# Patient Record
Sex: Male | Born: 1935 | Race: White | Hispanic: No | Marital: Married | Smoking: Never smoker
Health system: Southern US, Community
[De-identification: ages and names within clinical notes are randomized; demographics above are authoritative.]

## PROBLEM LIST (undated history)

## (undated) DIAGNOSIS — D649 Anemia, unspecified: Secondary | ICD-10-CM

## (undated) DIAGNOSIS — K219 Gastro-esophageal reflux disease without esophagitis: Secondary | ICD-10-CM

## (undated) DIAGNOSIS — E119 Type 2 diabetes mellitus without complications: Secondary | ICD-10-CM

## (undated) DIAGNOSIS — I2510 Atherosclerotic heart disease of native coronary artery without angina pectoris: Secondary | ICD-10-CM

## (undated) DIAGNOSIS — I10 Essential (primary) hypertension: Secondary | ICD-10-CM

## (undated) DIAGNOSIS — F0390 Unspecified dementia without behavioral disturbance: Secondary | ICD-10-CM

## (undated) DIAGNOSIS — N189 Chronic kidney disease, unspecified: Secondary | ICD-10-CM

## (undated) DIAGNOSIS — J189 Pneumonia, unspecified organism: Secondary | ICD-10-CM

## (undated) DIAGNOSIS — R4182 Altered mental status, unspecified: Secondary | ICD-10-CM

## (undated) HISTORY — PX: CORONARY ARTERY BYPASS GRAFT: SHX141

## (undated) HISTORY — PX: JOINT REPLACEMENT: SHX530

## (undated) HISTORY — PX: REPLACEMENT TOTAL KNEE BILATERAL: SUR1225

---

## 2000-04-08 ENCOUNTER — Encounter: Admission: RE | Admit: 2000-04-08 | Discharge: 2000-04-08 | Payer: Self-pay | Admitting: *Deleted

## 2000-04-08 ENCOUNTER — Encounter: Payer: Self-pay | Admitting: *Deleted

## 2000-07-23 ENCOUNTER — Ambulatory Visit (HOSPITAL_COMMUNITY): Admission: RE | Admit: 2000-07-23 | Discharge: 2000-07-23 | Payer: Self-pay | Admitting: Gastroenterology

## 2000-07-23 ENCOUNTER — Encounter (INDEPENDENT_AMBULATORY_CARE_PROVIDER_SITE_OTHER): Payer: Self-pay | Admitting: Specialist

## 2000-08-17 ENCOUNTER — Emergency Department (HOSPITAL_COMMUNITY): Admission: EM | Admit: 2000-08-17 | Discharge: 2000-08-17 | Payer: Self-pay | Admitting: Emergency Medicine

## 2003-06-19 ENCOUNTER — Emergency Department (HOSPITAL_COMMUNITY): Admission: EM | Admit: 2003-06-19 | Discharge: 2003-06-19 | Payer: Self-pay | Admitting: Emergency Medicine

## 2003-06-19 ENCOUNTER — Encounter: Payer: Self-pay | Admitting: Emergency Medicine

## 2003-07-14 ENCOUNTER — Encounter: Payer: Self-pay | Admitting: Internal Medicine

## 2003-07-14 ENCOUNTER — Encounter: Admission: RE | Admit: 2003-07-14 | Discharge: 2003-07-14 | Payer: Self-pay | Admitting: Internal Medicine

## 2003-11-02 ENCOUNTER — Ambulatory Visit (HOSPITAL_COMMUNITY): Admission: RE | Admit: 2003-11-02 | Discharge: 2003-11-02 | Payer: Self-pay | Admitting: Sports Medicine

## 2003-12-17 ENCOUNTER — Encounter: Admission: RE | Admit: 2003-12-17 | Discharge: 2003-12-17 | Payer: Self-pay | Admitting: Sports Medicine

## 2004-06-19 ENCOUNTER — Encounter: Admission: RE | Admit: 2004-06-19 | Discharge: 2004-06-19 | Payer: Self-pay | Admitting: Sports Medicine

## 2004-09-14 ENCOUNTER — Ambulatory Visit (HOSPITAL_COMMUNITY): Admission: RE | Admit: 2004-09-14 | Discharge: 2004-09-14 | Payer: Self-pay | Admitting: Gastroenterology

## 2004-09-14 ENCOUNTER — Encounter (INDEPENDENT_AMBULATORY_CARE_PROVIDER_SITE_OTHER): Payer: Self-pay | Admitting: *Deleted

## 2005-05-23 ENCOUNTER — Emergency Department (HOSPITAL_COMMUNITY): Admission: EM | Admit: 2005-05-23 | Discharge: 2005-05-23 | Payer: Self-pay | Admitting: Emergency Medicine

## 2005-05-30 ENCOUNTER — Encounter: Admission: RE | Admit: 2005-05-30 | Discharge: 2005-05-30 | Payer: Self-pay | Admitting: Internal Medicine

## 2005-08-30 ENCOUNTER — Observation Stay (HOSPITAL_COMMUNITY): Admission: EM | Admit: 2005-08-30 | Discharge: 2005-09-01 | Payer: Self-pay | Admitting: *Deleted

## 2005-08-31 ENCOUNTER — Encounter (INDEPENDENT_AMBULATORY_CARE_PROVIDER_SITE_OTHER): Payer: Self-pay | Admitting: *Deleted

## 2006-03-06 ENCOUNTER — Ambulatory Visit: Payer: Self-pay | Admitting: Critical Care Medicine

## 2006-03-06 ENCOUNTER — Encounter: Admission: RE | Admit: 2006-03-06 | Discharge: 2006-03-06 | Payer: Self-pay | Admitting: Internal Medicine

## 2006-03-06 ENCOUNTER — Inpatient Hospital Stay (HOSPITAL_COMMUNITY): Admission: EM | Admit: 2006-03-06 | Discharge: 2006-04-11 | Payer: Self-pay | Admitting: *Deleted

## 2006-03-11 ENCOUNTER — Ambulatory Visit: Payer: Self-pay | Admitting: Internal Medicine

## 2006-04-24 ENCOUNTER — Observation Stay (HOSPITAL_COMMUNITY): Admission: EM | Admit: 2006-04-24 | Discharge: 2006-04-25 | Payer: Self-pay | Admitting: Emergency Medicine

## 2006-05-24 ENCOUNTER — Ambulatory Visit (HOSPITAL_COMMUNITY): Admission: RE | Admit: 2006-05-24 | Discharge: 2006-05-24 | Payer: Self-pay | Admitting: Internal Medicine

## 2006-06-12 ENCOUNTER — Emergency Department (HOSPITAL_COMMUNITY): Admission: EM | Admit: 2006-06-12 | Discharge: 2006-06-12 | Payer: Self-pay | Admitting: Emergency Medicine

## 2006-08-10 ENCOUNTER — Encounter: Admission: RE | Admit: 2006-08-10 | Discharge: 2006-08-10 | Payer: Self-pay | Admitting: Internal Medicine

## 2007-05-11 IMAGING — CT CT ABDOMEN W/ CM
1 of 3 series · 13 of 32 positions shown, 19 images · IV contrast (READICA/WATER & [ID] OMNI 300)
Comparison: none

CLINICAL DATA: Weight loss.
 CHEST CT WITH CONTRAST:
TECHNIQUE: Multidetector CT imaging of the chest was performed following the standard protocol during bolus administration of intravenous contrast.
 Contrast:  100 cc of Omnipaque 300.
 On lung window images, no lung nodule is seen.  No effusion is noted.  Prior changes of median sternotomy for CABG are noted.  On mediastinal window images, there is no evidence of mediastinal or hilar adenopathy.  The pulmonary arteries and thoracic aorta appear grossly normal.  The thoracic aorta enhances normally with no evidence of aneurysm.  No bony abnormality is seen.

[Series 2: chest/abd/pelvis · axial · 0.84mm/px · z∈[-372,+12]mm · 13 of 91 slices shown, 19 images]
[im 7/91  soft-tissue]
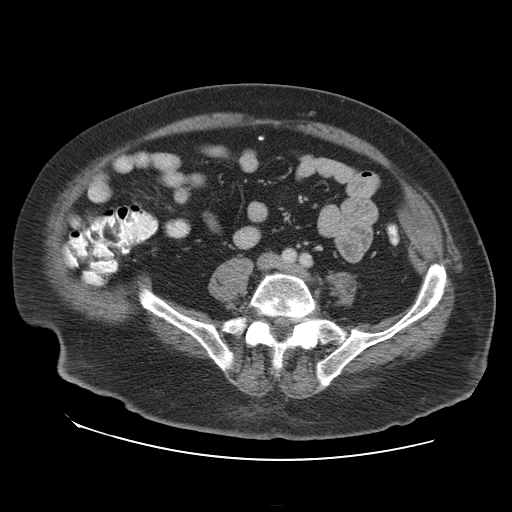
[im 7/91  bone]
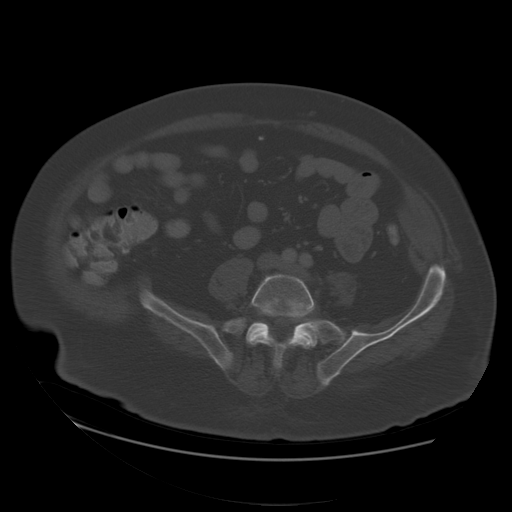
[im 13/91  soft-tissue]
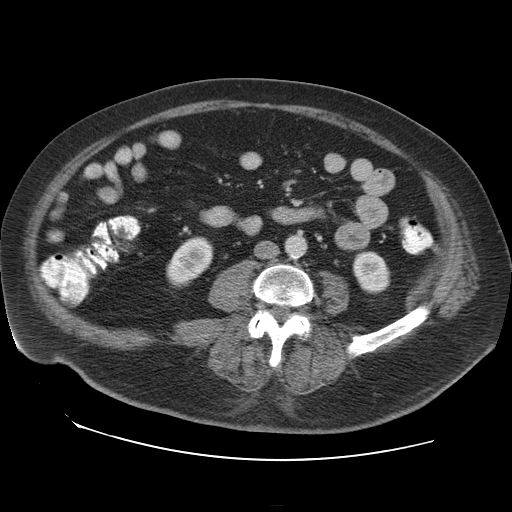
[im 19/91  soft-tissue]
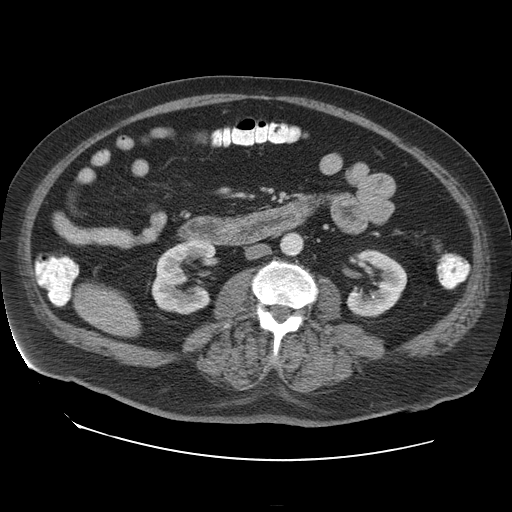
[im 25/91  soft-tissue]
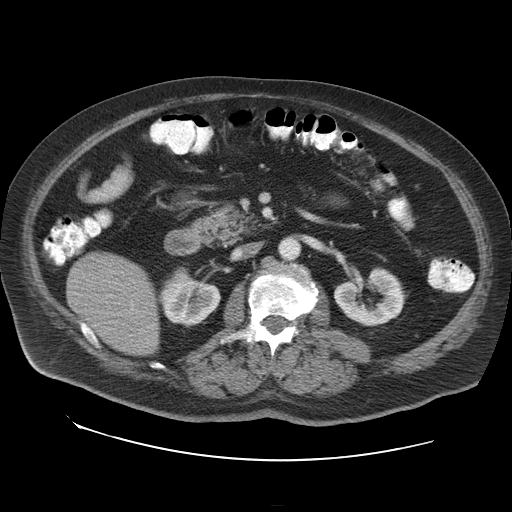
[im 31/91  soft-tissue]
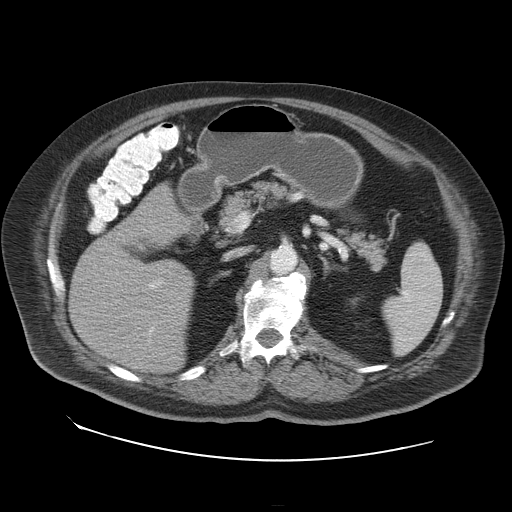
[im 37/91  soft-tissue]
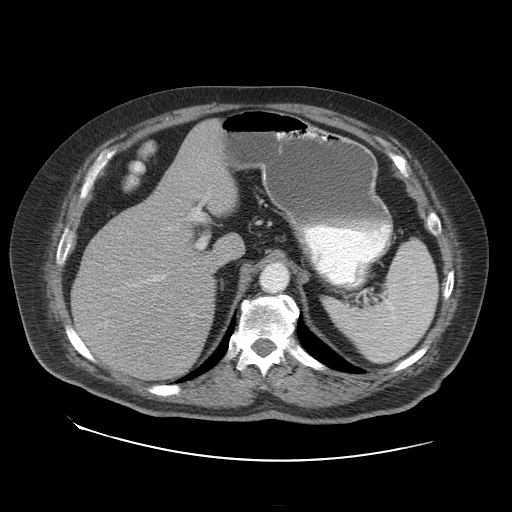
[im 49/91  soft-tissue]
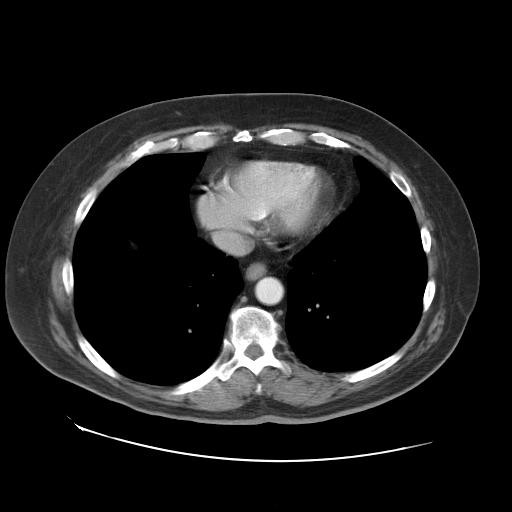
[im 55/91  soft-tissue]
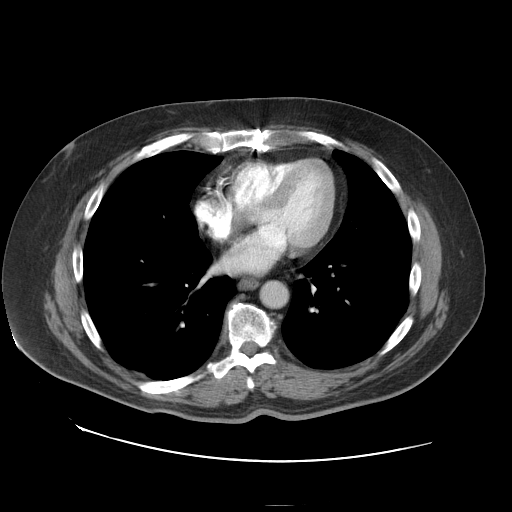
[im 61/91  soft-tissue]
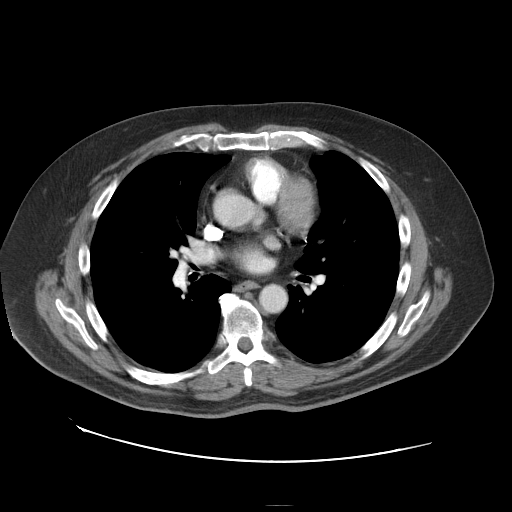
[im 61/91  bone]
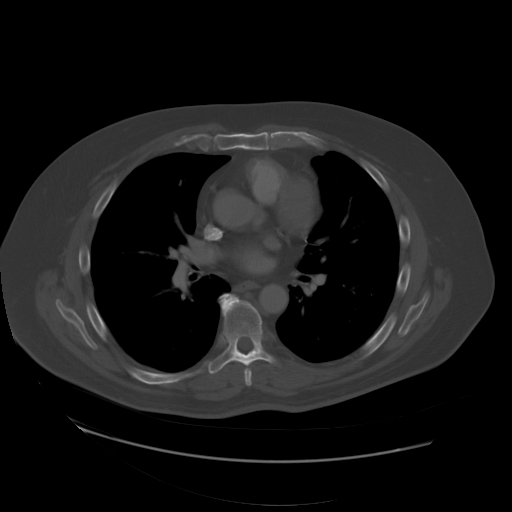
[im 67/91  soft-tissue]
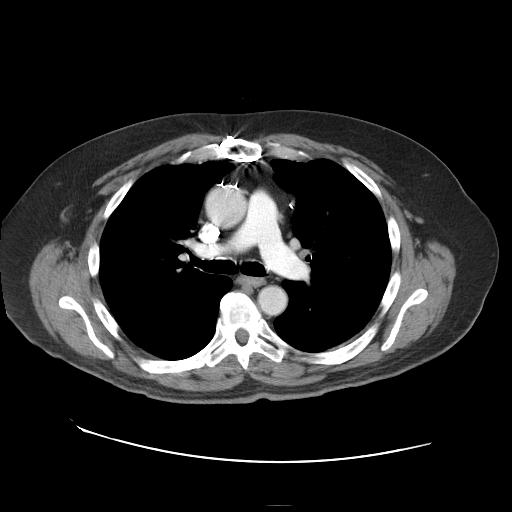
[im 67/91  lung]
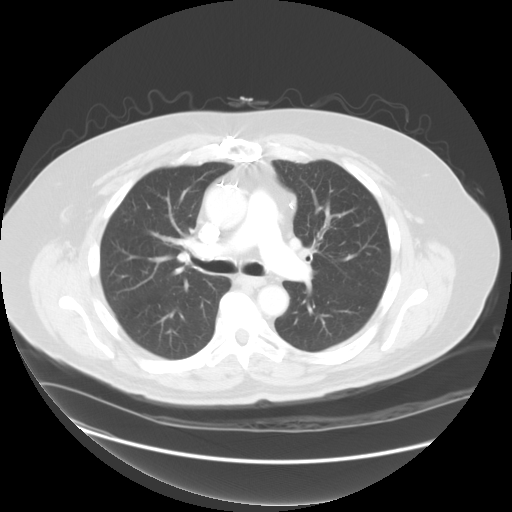
[im 73/91  soft-tissue]
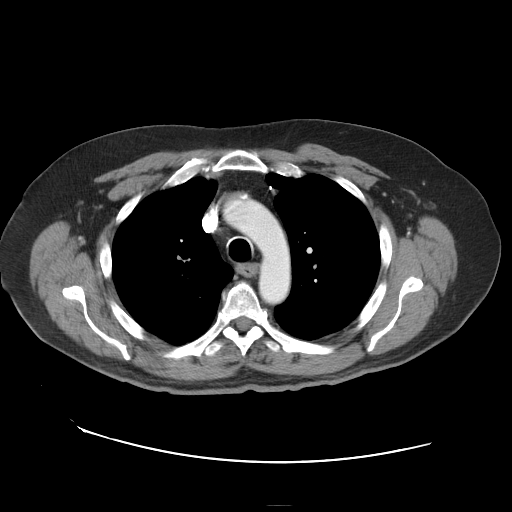
[im 73/91  lung]
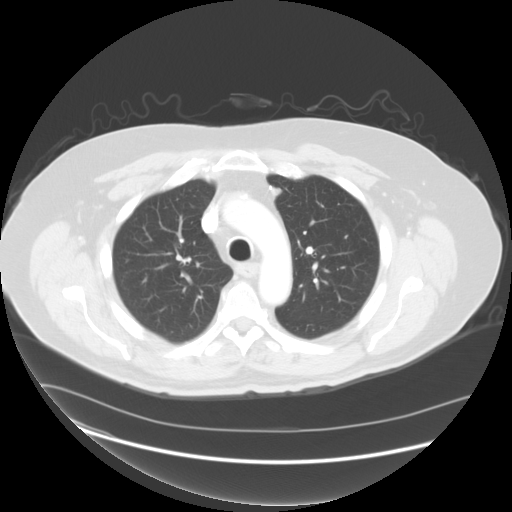
[im 79/91  soft-tissue]
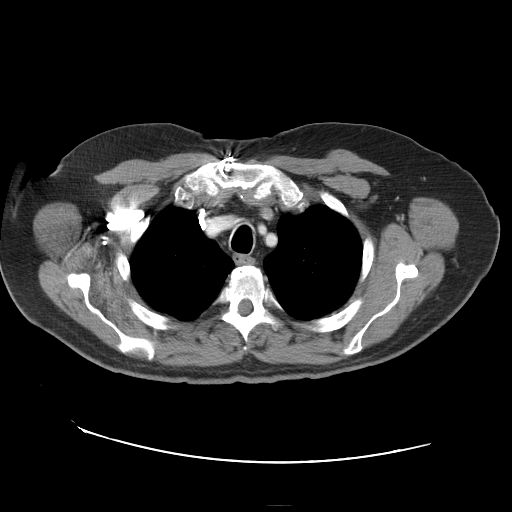
[im 79/91  lung]
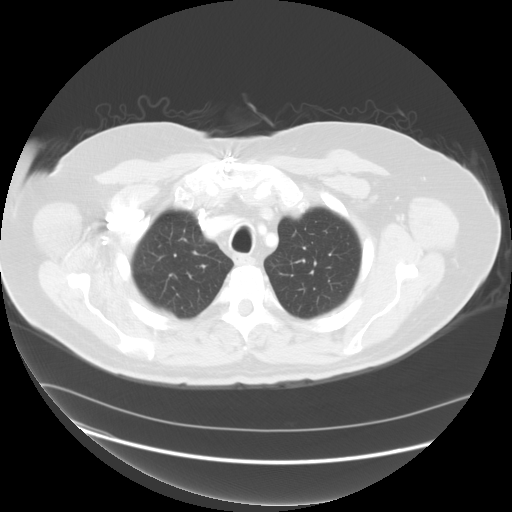
[im 85/91  soft-tissue]
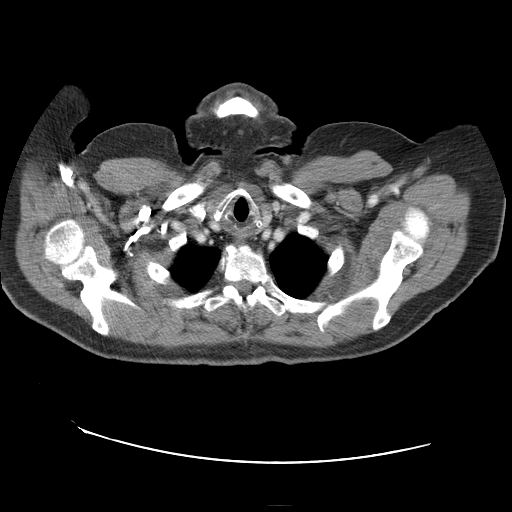
[im 85/91  lung]
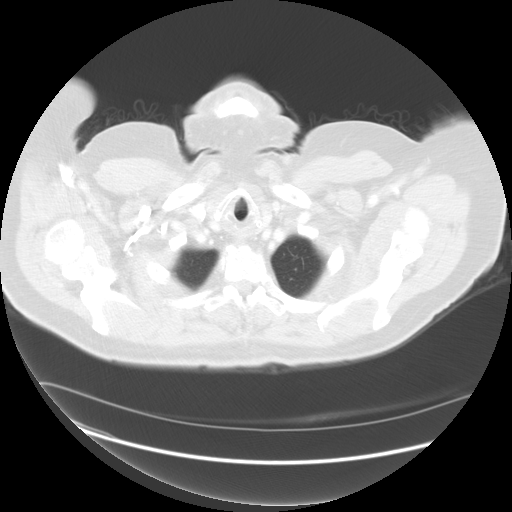

[13 of 32 positions shown; findings below may reference images not displayed]

IMPRESSION: Negative CT of the chest.  Prior CABG.
 ABDOMEN CHEST WITH CONTRAST:
 Scans were continued through the abdomen after oral and IV contrast were given.  The liver enhances normally with no focal abnormality and no ductal dilatation is seen. Gallbladder is contracted, and no calcified gallstones are seen.   The pancreas is normal in size with normal peripancreatic fat planes.  The adrenal glands and spleen appear normal.  Kidneys enhance normally and, on delayed images, pelvocaliceal systems appear normal.  The abdominal aorta is normal in caliber.  There are degenerative changes in the lower lumbar spine.
IMPRESSION: 1.  Negative CT of the abdomen for mass or adenopathy.
 2.  Degenerative changes noted in the lower lumbar spine.

## 2007-07-31 ENCOUNTER — Encounter: Admission: RE | Admit: 2007-07-31 | Discharge: 2007-07-31 | Payer: Self-pay | Admitting: Internal Medicine

## 2009-04-06 ENCOUNTER — Encounter: Admission: RE | Admit: 2009-04-06 | Discharge: 2009-04-06 | Payer: Self-pay | Admitting: Internal Medicine

## 2009-12-16 ENCOUNTER — Emergency Department (HOSPITAL_COMMUNITY): Admission: EM | Admit: 2009-12-16 | Discharge: 2009-12-16 | Payer: Self-pay | Admitting: Emergency Medicine

## 2010-11-19 ENCOUNTER — Encounter (HOSPITAL_BASED_OUTPATIENT_CLINIC_OR_DEPARTMENT_OTHER): Payer: Self-pay | Admitting: Internal Medicine

## 2010-11-30 ENCOUNTER — Other Ambulatory Visit: Payer: Self-pay | Admitting: Dermatology

## 2011-01-17 LAB — DIFFERENTIAL
Basophils Relative: 0 % (ref 0–1)
Eosinophils Absolute: 0 10*3/uL (ref 0.0–0.7)
Lymphocytes Relative: 4 % — ABNORMAL LOW (ref 12–46)
Monocytes Relative: 4 % (ref 3–12)

## 2011-01-17 LAB — POCT I-STAT, CHEM 8
Chloride: 107 mEq/L (ref 96–112)
Creatinine, Ser: 1.5 mg/dL (ref 0.4–1.5)
Glucose, Bld: 177 mg/dL — ABNORMAL HIGH (ref 70–99)
HCT: 42 % (ref 39.0–52.0)
Hemoglobin: 14.3 g/dL (ref 13.0–17.0)
Sodium: 139 mEq/L (ref 135–145)

## 2011-01-17 LAB — CBC
Hemoglobin: 14.6 g/dL (ref 13.0–17.0)
RBC: 4.42 MIL/uL (ref 4.22–5.81)
WBC: 6.6 10*3/uL (ref 4.0–10.5)

## 2011-01-29 ENCOUNTER — Other Ambulatory Visit: Payer: Self-pay | Admitting: Internal Medicine

## 2011-01-29 DIAGNOSIS — R52 Pain, unspecified: Secondary | ICD-10-CM

## 2011-01-29 DIAGNOSIS — R609 Edema, unspecified: Secondary | ICD-10-CM

## 2011-01-30 ENCOUNTER — Ambulatory Visit
Admission: RE | Admit: 2011-01-30 | Discharge: 2011-01-30 | Disposition: A | Discharge status: Home / Self Care | Payer: Medicare Other | Source: Ambulatory Visit | Attending: Internal Medicine | Admitting: Internal Medicine

## 2011-01-30 DIAGNOSIS — R609 Edema, unspecified: Secondary | ICD-10-CM

## 2011-01-30 DIAGNOSIS — R52 Pain, unspecified: Secondary | ICD-10-CM

## 2011-03-16 NOTE — H&P (Signed)
NAME:  Charles Guzman, Charles Guzman NO.:  [id number]   MEDICAL RECORD NO.:  [id number]          PATIENT TYPE:  INP   LOCATION:  0102                         FACILITY:  [facility]   PHYSICIAN:  Hillery Aldo, M.D.   DATE OF BIRTH:  August 08, 1929   DATE OF ADMISSION:  04/24/2006  DATE OF DISCHARGE:                                HISTORY & PHYSICAL   PRIMARY CARE PHYSICIAN:  Dr. Karilyn Cota.   CHIEF COMPLAINT:  None.  The patient was sent here for an elevated INR.   HISTORY OF PRESENT ILLNESS:  The patient is a 77 year old male with a recent  prolonged hospitalization secondary to ventilatory-dependent respiratory  failure, bilateral pneumonia, ARDS, and MRSA, as well as soon as Pseudomonas  pneumonia.  He was hospitalized from Mar 06, 2006 through April 11, 2006.  The  patient is on chronic Coumadin therapy due to pulmonary embolism diagnosed  in 1994.  He was sent to the emergency department by his skilled nursing  facility after routine lab work showed that his PT was greater than 90 and  his INR greater than 11.9.  The patient denies any bleeding gums, blood in  the urine, blood in the stool, or any other signs of bleeding.  He is  admitted for reversal of his Coumadin.   PAST MEDICAL HISTORY:  1.  Recent prolonged hospitalization secondary to ventilator-dependent      respiratory failure secondary to MRSA and pseudomonal pneumonia along      with adult respiratory distress syndrome.  2.  Bacteremia/sepsis secondary to MRSA.  3.  Pulmonary embolism in 1994, on chronic Coumadin therapy.  4.  History of metabolic encephalopathy.  5.  Coronary artery disease, status post coronary artery bypass graft in      1995.  6.  Hypertension.  7.  Dysphasia.  8.  Diarrhea, chronic.  9.  Acute-on-chronic kidney disease.  10. Type 2 diabetes mellitus.  11. Anemia of chronic disease and iron deficiency, status post 2 units of      packed red blood cells during his previous hospitalization.  12.  Stage I to II decubitus ulcer of the sacrum.  13. Deconditioning.  14. Status post bilateral total knee replacements.  15. Hyperlipidemia.  16. Chronic lower extremity edema, greater on the left than the right with a      history of thrombophlebitis.  17. Internal and external hemorrhoids on colonoscopy in the past.  18. Remote history of tobacco abuse.  19. Obesity.   FAMILY HISTORY:  Noncontributory due to advanced age.   SOCIAL HISTORY:  The patient is a resident of [facility].  He is married.  He has a remote history of tobacco use, no alcohol or drug use.   ALLERGIES:  No known drug allergies.   CURRENT MEDICATIONS:  1.  Ferrous sulfate 325 mg daily.  2.  Lipitor 20 mg nightly.  3.  Questran 2 g daily.  4.  Pepto-Bismol on tablet b.i.d. p.r.n.  5.  Coumadin 4 mg daily.  6.  Metoprolol 50 mg b.i.d.  7.  Multivitamin with iron daily.  8.  Pepcid 20 mg b.i.d.  9.  Flora Q one capsule daily.  10. Ensure pudding t.i.d.  11. Sliding-scale insulin q.a.c. and h.s.  12. Diflucan 100 mg daily.   REVIEW OF SYSTEMS:  As per HPI.  The patient has some chronic diarrhea  otherwise.  No chest pain, shortness of breath, cough, or other symptoms.   PHYSICAL EXAM:  VITAL SIGNS:  Temperature 98.3, pulse 78, respirations 18,  blood pressure 109/56, O2 saturation 99% on room air.  GENERAL:  Elderly male in no acute distress.  HEENT:  Normocephalic, atraumatic.  PERRL.  EOMI.  Oropharynx is clear.  NECK:  Supple, no thyromegaly, no lymphadenopathy, no jugular venous  distention.  CHEST:  Lungs clear to auscultation bilaterally with good air movement.  HEART:  Regular rate, rhythm.  No murmurs, rubs, or gallops.  ABDOMEN:  Soft, nontender, non-distended with normoactive bowel sounds.  RECTAL:  Deferred, given increased INR.  EXTREMITIES:  The patient does have asymmetric edema with the left lower  extremity much greater than the right.  SKIN:  The patient is pale.  No  rashes.  He does  have a stage I to II decubitus ulcer with non-blanching  redness about the sacrum and some areas of skin with a denuded appearance.  NEUROLOGIC:  The patient is sleeping, but awakens easily.  He is oriented to  self and place.  Moves all extremities x4 with equal strength.  Nonfocal.   DATA REVIEW:  PT is 86.8, INR 11.4.  Hemoglobin is 11.6, hematocrit 34.0.   ASSESSMENT AND PLAN:  Supratherapeutic INR:  We will admit the patient for  observation and reversal of his Coumadin.  Would give him 2 units of fresh  frozen plasma now and 2 mg of vitamin K orally.  We will reinitiate Coumadin  treatment per Pharmacy, once his INR is approaching the therapeutic level.   Would continue his routine medicines for his chronic medical problems.      Hillery Aldo, M.D.  Electronically Signed     CR/MEDQ  D:  04/24/2006  T:  04/24/2006  Job:  [id number]   cc:   Wilson Singer, M.D.  Fax: [phone]

## 2011-03-16 NOTE — Op Note (Signed)
NAME:  Charles Guzman, Charles Guzman NO.:  [id number]   MEDICAL RECORD NO.:  [id number]          PATIENT TYPE:  AMB   LOCATION:  ENDO                         FACILITY:  [facility]   PHYSICIAN:  Petra Kuba, M.D.    DATE OF BIRTH:  26-Mar-1929   DATE OF PROCEDURE:  09/14/2004  DATE OF DISCHARGE:                                 OPERATIVE REPORT   PROCEDURE:  Colonoscopy with biopsy.   INDICATIONS:  Patient with a history of colon polyps, due for colonic  screening.  Also some lower abdominal pain with alternating diarrhea and  constipation.   Consent was signed after risks, benefits, methods, and options thoroughly  discussed in the office on multiple occasions.   MEDICINES USED:  Demerol 70, Versed 7.   PROCEDURE:  Rectal inspection is pertinent for external hemorrhoids, small.  Digital exam was negative.  The video pediatric adjustable colonoscope was  inserted and with abdominal pressure, easily able to be advanced around the  colon to the cecum.  On insertion, no obvious abnormalities were seen.  The  cecum was identified by the appendiceal orifice and the ileocecal valve.  The prep was adequate.  There was some stool that required washing and  suctioning, but on slow withdrawal through the colon, three tiny  questionable polyps were seen, two in the transverse, one in the proximal  sigmoid.  All were cold biopsied x1-2.  The transverse was put in one  container, and the sigmoid was put in a second container.  No other  abnormalities were seen as we slowly withdrew back to the rectum.  Anorectal  pull-through and retroflexion confirmed some small hemorrhoids.  The scope  was straightened and readvanced shortways up the left side of the colon.  Air and water were suctioned.  The scope removed.  The patient tolerated the  procedure well.  There was no obvious immediate complication.   ENDOSCOPIC DIAGNOSES:  1.  Internal/external hemorrhoids.  2.  Three tiny questionable  polyps, one in the sigmoid and two in the      transverse, all cold-biopsied.  3.  Otherwise within normal limits to the cecum.   PLAN:  Await pathology but barring a significant surprise, consider  rechecking in five years if doing well medically, otherwise will restart his  Coumadin as usual dose.  Happy to see back p.r.n.  If pain continues,  probably  will need a one-time CT scan, either by me or Dr. Karilyn Cota.  He might  consider a fiber supplement adding to his regimen, to see if that would help  granulate his bowels.  Possibly a good cleaning will help.  I will be happy  to see back sooner p.r.n.      MEM/MEDQ  D:  09/14/2004  T:  09/14/2004  Job:  [id number]   cc:   Wilson Singer, M.D.  [address]  [place]  [place] [address]  Fax: [phone]

## 2011-03-16 NOTE — Discharge Summary (Signed)
NAME:  Charles Guzman, Charles Guzman.:  [id number]   MEDICAL RECORD NO.:  [id number]           PATIENT TYPE:   LOCATION:                               FACILITY:  [facility]   PHYSICIAN:  Michaelyn Barter, M.D. DATE OF BIRTH:  30-May-1929   DATE OF ADMISSION:  03/06/2006  DATE OF DISCHARGE:  04/11/2006                               DISCHARGE SUMMARY   This is a final interim discharge summary that will follow the discharge  summary that was dictated by Dr. Elliot Cousin on April 05, 2006.  For  events that occurred between Mar 06, 2006 up until the April 05, 2006,  please refer to the discharge summary that was dictated by Dr. Elliot Cousin.  This discharge summary will only outline those events that  occurred during the time frame of April 08, 2006 up until April 11, 2006.    1. Metabolic encephalopathy.  This appeared to be in the state of      resolution.  2. MRSA/Pseudomonas pneumonia.  Likewise, this also appeared to be      resolved.  The patient remained without any new complaints      regarding this.  He did not demonstrate any overt signs of      respiratory distress during the last few days of his      hospitalization.  3. Pulmonary embolism.  The patient did not have any complaints of      chest pain or shortness of breath.  He remained on his      anticoagulation during the last few days of hospitalization.  4. Diarrhea is resolved during the last few days of his      hospitalization.  5. Anemia.  The patient's hemoglobin was noted to have declined to 8.9      by April 09, 2006.  As a result, he received at least 1 unit of      packed RBCs.  His hemoglobin remained stable throughout the      remaining portion of his hospitalization.  6. Diabetes mellitus.  The patient's sugars remained slightly elevated      during the course of his hospitalization.  It was determined that      this could further be followed by his primary care physician once      he is discharged from  the hospital.  7. Chronic kidney disease.  The patient's BUN and creatinine remained      relatively stable, although slightly above normal in the latter      portion of his hospitalization.  8. History of hypertension.  The patient's blood pressure remained      stable during the last portion of his hospitalization.   The decision was made to discharge the patient from the hospital.  On  the date of discharge, the patient had no new complaints.   VITAL SIGNS:  Temperature was 97.3, heart rate 107, respirations 18,  blood pressure 124/62.  He was sating 95% on room air.   His white blood cell count was 12.6, hemoglobin 11, hematocrit 32,  platelets 155.  PT 24.7, INR 2.2.  Sodium 136, potassium 4, chloride  109, CO2 19, BUN 14, creatinine 1.6, glucose 124.  INR 2.2.  Calcium  8.3.   The decision was made to discharge the patient to [facility]  [place], subacute nursing facility.      Michaelyn Barter, M.D.  Electronically Signed     OR/MEDQ  D:  12/05/2006  T:  12/05/2006  Job:  [id number]

## 2011-03-16 NOTE — Procedures (Signed)
[facility]  Patient:    Charles Guzman, Charles Guzman                   MRN: [id number] Proc. Date: 07/23/00 Adm. Date:  [id number] Attending:  Nelda Marseille CC:         Georg Ruddle. Viviann Spare, M.D.   Procedure Report  PROCEDURE:  Colonoscopy with polypectomy.  INDICATIONS:  Guaiac positivity in a patient due for colonic screening.  INFORMED CONSENT:  Consent was signed after risk, benefits, methods and options thoroughly discussed in the office.  MEDICINES USED:  Demerol 50 mg, Versed 5 mg.  DESCRIPTION OF PROCEDURE:  Rectal inspection is pertinent for his previous hemorrhoidal or fissure surgery.  Digital examination by Dr. Riki Altes, the internal medicine resident working with [place] this month was negative. Dr. Gwendolyn Fill inserted the video colonoscope under my guidance and advanced easily to about 90 cm.  At that point the scope began to loop and I took over the controls and was easily able to advance to the cecum.  On insertion, three left-sided polyps were seen.  To advance to the cecum required rolling him on his back and some abdominal pressure.  The cecum was identified by the appendicial orifice and the ileocecal valve.  In fact the scope was inserted a short ways in the terminal ileum which was normal.  Further documentation was obtained.  The scope was slowly withdrawn.  Prep was adequate.  There was some liquid stool that required washing and suctioning.  On slow withdrawal through the colon, the cecum, ascending and transverse were normal.  The scope was withdrawn around the left side of the colon.  The polyps seen on insertion were brought into view.  They were about the mid sigmoid tube distal descending.  All three were snared.  Electrocautery applied.  The two smaller ones were able to be suctioned through the scope and collected in the trap. The middle one was removed, suctioned onto the head of the scope and the scope was removed and the  polyp recovered.  The scope was reinserted to the polypectomy sites.  All had nice white coagulants without any bleeding.  The scope was further withdrawn back in the rectum.  A 2 mm polyp was seen, snared, electrocautery applied and the polyp removed.  The scope was retroflexed revealing some small internal hemorrhoids.  The scope was straightened and readvanced a short ways up the left side of the colon.  Air was suctioned.  The scope removed.  The patient tolerated the procedure well. There was no obvious intermediate complication.  ENDOSCOPIC DIAGNOSIS: 1. Internal and external hemorrhoids. 2. Tiny rectal polyp, status post snare. 3. Three small to medium sigmoid distal descending polyps, all snared. 4. Otherwise within normal limits to the terminal ileum.  PLAN:  Restart Coumadin in three days.  Use 2.5 a day for a week and then regular dose is 5 on Monday, Wednesday, Friday, alternating with 2.5 on the other days.  GI followup p.r.n. or in six weeks to recheck guaiac symptoms to make sure a one time upper GI small-bowel follow-through is not needed and probably recheck colon screening if doing well in three years pending pathology. DD:  07/23/00 TD:  07/24/00 Job: [id number]

## 2011-03-20 NOTE — Discharge Summary (Signed)
NAME:  Charles Guzman, Charles Guzman NO.:  [id number]   MEDICAL RECORD NO.:  [id number]          PATIENT TYPE:  INP   LOCATION:  1422                         FACILITY:  [facility]   PHYSICIAN:  Isidor Holts, M.D.  DATE OF BIRTH:  Jan 28, 1929   DATE OF ADMISSION:  04/24/2006  DATE OF DISCHARGE:  04/25/2006                                 DISCHARGE SUMMARY   PMD:  Dr. Karilyn Cota   DISCHARGE DIAGNOSES:  1.  Coagulopathy, secondary to over anticoagulation with Coumadin.  2.  History of previous pulmonary embolism, on chronic anticoagulation.  3.  History of coronary artery disease, status post coronary artery bypass      graft 1995.  4.  Hypertension.  5.  Diabetes mellitus.  6.  Anemia of chronic disease.  7.  Dyslipidemia.  8.  History of chronic diarrhea.  9.  Dysphagia.   DISCHARGE MEDICATIONS:  1.  Ferrous sulfate 325 mg p.o. daily.  2.  Lipitor 20 mg p.o. q.h.s.  3.  Questran 2 g p.o. daily.  4.  Pepto-Bismol one tablet p.o. b.i.d. p.r.n.  5.  Metoprolol 50 mg p.o. b.i.d.  6.  Multivitamin with iron one pill p.o. daily.  7.  Pepcid 20 mg p.o. b.i.d.  8.  Fluoro-Q one capsule p.o. daily.  9.  Ensure pudding one p.o. t.i.d.  10. Sliding scale insulin q.a.c. and h.s.  11. Coumadin 3 mg p.o. daily, otherwise as per INR.  12. Note, Diflucan has been discontinued.   PROCEDURES:  None.   CONSULTATIONS:  None.   ADMISSION HISTORY:  As in H&P note of April 24, 2006 dictated by Dr.  Hillery Aldo.  However, in brief, this is a 77 year old male, with  multiple medical problems, including coronary artery disease, prior  pulmonary embolism on chronic anti-Coumadin therapy, hypertension,  dysphasia, chronic diarrhea, chronic renal insufficiency, type 2 diabetes  mellitus, anemia of chronic disease, dyslipidemia.  Resident of skilled  nursing facility.  Status post hospitalization from Mar 06, 2006 to April 11, 2006.  He was referred to the emergency department on April 24, 2006  for an  incidental finding of INR greater than 11.9 unassociated with bruising or  overt bleeding.  He was admitted for reversal of anticoagulation.   CLINICAL COURSE:  #1 - OVER ANTICOAGULATION:  This is secondary to Coumadin  treatment.  Patient had until recently been on Diflucan and it is felt that  this may be the culprit secondary to drug-drug interaction.  His INR at the  time of presentation was 11.9, however, there were no overt signs of  bleeding or bruising and his hemoglobin was reasonable at 11.6 with a  hematocrit of 34.  Patient was managed with intravenous infusion of 2 units  of FFP as well as intravenous vitamin K with complete reversal of  anticoagulation.  As a matter of fact, INR as of April 25, 2006 was 1.5.  Patient has been recommenced on Coumadin therapy, at a lower dose of 3mg   daily initially, and it is recommended that his INR be checked on 04/27/2006,  and further adjustments be made, if indicated.  Background clinical problems remained otherwise stable, and there were no  other issues, during patient`s hospitalization.   DISPOSITION:  Patient was discharged in satisfactory condition on 04/25/06.   DIET:  Healthy Heart/Carbohydrate-modified.   ACTIVITY:  As tolerated, otherwise, per PT/OT.   PAIN MANAGEMENT:  N/A   FOLLOW-UP:  Patient is to follow up routinely with primary MD, per prior  scheduled appointment.  We expect PMD to monitor INR at appropriate intervals, and adjust Coumadin  dosage as indicated.      Isidor Holts, M.D.  Electronically Signed     CO/MEDQ  D:  04/25/2006  T:  04/25/2006  Job:  [id number]

## 2011-03-20 NOTE — H&P (Signed)
NAME:  Charles Guzman, Charles Guzman NO.:  [id number]   MEDICAL RECORD NO.:  [id number]          PATIENT TYPE:  EMS   LOCATION:  MAJO                         FACILITY:  [facility]   PHYSICIAN:  Lonia Blood, M.D.DATE OF BIRTH:  1929/10/08   DATE OF ADMISSION:  03/06/2006  DATE OF DISCHARGE:                                HISTORY & PHYSICAL   PRIMARY CARE PHYSICIAN:  Dr. Karilyn Cota   CARDIOVASCULAR PHYSICIAN:  Dr. Swaziland   CHIEF COMPLAINT:  Shortness of breath and chest pain.   HISTORY OF PRESENT ILLNESS:  Mr. Charles Guzman is a very pleasant 76-year-  old gentleman with an extensive medical history as detailed below.  Approximately 4 days ago he began to experience the acute onset of a left-  sided lower chest discomfort.  He describes this as a tearing and a sharp  sensation.  It is worse if he takes a deep cough.  Approximately 24 hours  later he began to develop significant cough productive of very thick  yellowish and greenish sputum.  He thinks he may have had fever but has not  measured his temperature at home.  He has felt very weak.  His p.o. intake  has been limited.  He has continued to take his medications.  The primary  issue has been the pain, as far as he is concerned.  He presented to his  primary care physician and was set up for an outpatient CT scan.  The CT  scan was accomplished at [facility] today with evidence of a dense left lower lobe  and possible lingular infiltrate versus neoplasm.  He was advised to report  to [facility] emergency room because of significant respiratory distress.   At the time of my evaluation the patient is resting in a hospital bed at  [facility].  He states that he continues to have pleuritic-type pain in his  left lateral ribcage but he denies that he is short of breath.  Nonetheless,  he is breathing at approximately 40 times per minute at present and taking  very shallow breaths.  He says that deep breaths do cause some  exacerbation  of his pain.  He denies nausea or vomiting.  He denies anginal-type  substernal pressure.  He denies change in bowel or bladder habits.   REVIEW OF SYSTEMS:  Comprehensive review of systems is unremarkable with the  exception of elements noted in history of present illness above.   PAST MEDICAL HISTORY:  1.  Coronary artery disease status post coronary artery bypass graft in      1995.  2.  Diabetes mellitus type 2.  3.  History of pulmonary embolus in 1994 with the patient being on Coumadin      since that time.  4.  Chronic renal insufficiency with baseline creatinine 1.2 in November      2006.  5.  Status post bilateral total knee replacements.  6.  Hyperlipidemia.  7.  Remote history of tobacco abuse.  8.  Chronic lower extremity edema with the left being worse than the right,      thought to be  secondary to thrombophlebitis.  9.  Internal and external hemorrhoids on colonoscopy in the past.   OUTPATIENT MEDICATIONS:  The patient is not clear on the medications that he  takes.  Old records indicate use of Coumadin, Glucophage, Altace, Lipitor,  and TriCor.   ALLERGIES:  No known drug allergies.   FAMILY HISTORY:  Noncontributory secondary to advanced age.   SOCIAL HISTORY:  The patient does not drink.  He currently lives with his  wife in [place].  There is no one else in the home with them.   DATA REVIEW:  White count is markedly elevated at 16,000.  Hemoglobin is low  at 11.8 with an MCV of 91.  Platelet count is normal.  Sodium is low at 129.  Potassium, chloride, and bicarb are all normal.  BUN is elevated at 24,  creatinine is elevated at 2.0, serum glucose is elevated at 305, calcium is  normal.  LFTs are all normal.  Albumin is 3.9.  Creatinine was 1.30 August 2005.  INR is elevated at 1.7.  Urinalysis reveals 250 glucose, small  hemoglobin, 15 of ketones, 30 of protein, but is otherwise unremarkable. D-  dimer is elevated at 0.69.  Myoglobin is  elevated at greater than 500.  CK-  MB and troponin I are negative.  CT scan of the chest reveals a left lower  lobe and lingular consolidation suspicious for infiltrate versus neoplasm  but no evidence of pulmonary embolus.   PHYSICAL EXAMINATION:  VITAL SIGNS:  Temperature 100.4, blood pressure  144/82, heart rate 116 to 127, respiratory rate 25, O2 saturation is 94% on  room air recorded, but presently 92% on 4 L per minute nasal cannula.  GENERAL:  Well-developed, well-nourished male who is in obvious respiratory  distress, was taking short breaths and breathing at 40 times per minute.  HEENT:  Normocephalic, atraumatic.  Pupils are equal and round but minimally  reactive/sluggish.  OC/OP is remarkable for dry mucous membranes.  NECK:  No appreciable JVD, lymphadenopathy, or thyromegaly.  LUNGS:  The patient has severe coarse bibasilar crackles with no expiratory  wheeze at the present time.  CARDIOVASCULAR:  Tachycardic at approximately 116 beats per minute to 127  beats per minute without appreciable gallop or rub.  ABDOMEN:  Overweight, soft, bowel sounds hypoactive but present.  No mass,  no ascites, nondistended.  EXTREMITIES:  Show 1+ right lower extremity edema and 3+ left lower  extremity edema without erythema or discharge.  NEUROLOGIC:  Alert and oriented x4.  Cranial nerves II-XII intact  bilaterally, 5/5 strength bilateral upper and lower extremities, intact  sensation to touch throughout.   IMPRESSION AND PLAN:  1.  Severe left lower lobe community-acquired pneumonia.  Because of the      severity of the patient's infiltrate there is even concern that there      could be an underlying mass.  We will treat the patient empirically for      his community-acquired pneumonia with Rocephin and azithromycin.  We      will need to reevaluate the patient's parenchyma with a CT scan after      the patient stabilizes from a clinical standpoint. 2.  Acute respiratory distress.   The patient is tachypneic at the present      time.  He is taking very short respirations (is splinting) because of      his chest pain.  We will use a modest amount of narcotic to attempt to  relieve his pain and we will encourage him to take slow deep breaths.      ABG has not been obtained and therefore I have ordered this for      evaluation of the patient's pCO2 and pO2.  I will place the patient in      stepdown as I fear that there is a high likelihood that the patient will      require intubation should his pattern of breathing not improve      significantly.  We will administer nebulizers in attempt to improve his      breathing.  3.  Dehydration.  The patient is clinically significantly dehydrated.  This      is likely secondary to fever with decreased p.o. intake.  We will      hydrate the patient judiciously using isotonic saline fluid.  We will      follow his BUN and creatinine ratio and physical exam.  4.  Hyponatremia.  This is thought to be secondary to hypovolemia.  We will      hydrate the patient with isotonic IV fluid and we will follow up labs in      the morning.  5.  Normocytic anemia.  The patient has a normocytic anemia.  The extent of      previous workup is not clear.  At this time I will avoid further workup      until the patient is more stable.  Review of office records may in fact      reveal that the patient has had this evaluated in the past.  I am aware      that he has had a colonoscopy within the last 5 years.  We will follow      his hemoglobin trend for now.  6.  History of pulmonary embolism.  Full anticoagulation will be maintained      using Lovenox.  7.  Diabetes mellitus.  We will hold all p.o. medications.  The patient will      be limited to a clear liquid diet as I fear that he may require      intubation later this evening.  Sliding scale insulin will be      administered as appropriate.      Lonia Blood, M.D.   Electronically Signed     JTM/MEDQ  D:  03/06/2006  T:  03/06/2006  Job:  [id number]   cc:   Wilson Singer, M.D.  Fax: [phone]

## 2011-03-20 NOTE — Discharge Summary (Signed)
NAME:  Charles Guzman, Charles Guzman NO.:  [id number]   MEDICAL RECORD NO.:  [id number]          PATIENT TYPE:  OBV   LOCATION:  2039                         FACILITY:  [facility]   PHYSICIAN:  Lonia Blood, M.D.      DATE OF BIRTH:  [birth year]   DATE OF ADMISSION:  08/30/2005  DATE OF DISCHARGE:  09/01/2005                                 DISCHARGE SUMMARY   PRIMARY CARE PHYSICIAN:  Dr. Wilson Singer.   CARDIOLOGIST:  Dr. Peter Swaziland.   DISCHARGE DIAGNOSES:  1.  Altered mental status change.  2.  Presumed syncopal episode.  3.  Hypoglycemia.  4.  Coronary artery disease, stable.  5.  History of diabetes, type 2.  6.  History of PE, on Coumadin therapy.  7.  Chronic kidney disease.  8.  Chronic left lower extremity edema bilaterally.   DISCHARGE MEDICATIONS:  1.  Metformin 1000 mg p.o. b.i.d.  2.  Glyburide reduced 5 mg b.i.d.  3.  Altace 5 mg b.i.d.  4.  Lipitor 20 mg daily.  5.  Fenofibrate 130 mg daily.   DISPOSITION:  The patient is being discharged home in good health. No  recurrence of altered mental status change in the hospital. The patient also  has glyburide dose change to a total of 5 mg bid from 10 mg b.i.d. He will  follow up with Dr. Karilyn Cota  who will adjust his medications as an  outpatient.   PROCEDURES PERFORMED:  1.  Head CT without contrast performed on August 30, 2005, shows no acute      intracranial pathology.  2.  2-D echo performed on August 31, 2005, showed normal EF of 55% to 65%.      Mildly increased aortic valve thickness. Otherwise, no left ventricular      wall motion abnormalities. Carotid Dopplers also performed on August 31, 2005, was normal with no evidence of ICA stenosis. Lower extremity      Doppler performed on August 31, 2005, showed no DVT, SVT, or Baker's      cyst.   CONSULTATIONS:  None.   BRIEF HISTORY:  Please refer to dictated history and physical by Dr. Lonia Blood, which showed, however, that this is a  77 year old man with history of  coronary artery disease and diabetes. He was brought to the ED by his  daughter secondary to passing out.  The patient did not remember what  happened for about four hours prior to coming in. The patient called his  daughter. He was lying on the floor at that time and needed her to get up.  He has had episodes of hypoglycemia frequently. Three days prior to coming  in EMS had to come out to his house to give him IV glucose.  In the ED he  was found to be stable. His vital signs were also stable. Sodium is 130,  potassium 4.6, chloride 106. His glucose was 128 on arrival. The patient was  admitted mainly for workup, possible syncope, but also hypoglycemia with  history of hypoglycemic episodes.   HOSPITAL COURSE:  Problem 1. Possible syncopal episode. Workup performed  included ruling out MI. Check a 2-D echo. The patient had a head CT  performed which was negative. The patient did not have any recurrent  symptoms in the hospital. He was mainly placed for observation. The only  thing observed was his sugars were dropping below 70s after admission. This  indicated that the patient was most likely having hypoglycemic episodes. He  is already on glyburide taking 20 mg a day in addition to metformin. The  patient admitted to not taking food as he used to since his wife went to a  nursing home from stroke and this may explain why, as an incident of taking  more glyburide but less food. He is currently stable. He will be discharged  after adjustment of his of sulfonylurea dosing.   Problem 2. Hypoglycemic episode. As indicated above, this was presumed to be  the cause of the patient's problems. He was initially placed on 50 mL of D-5  water in the hospital and his sugars were in the range of 117, 102, 101, and  132.  The patient resumed eating adequately the following morning and the D-  5 water was discontinued and his CBGs were checked.  All this time he was   off the sulfonylureas and currently he has no single episode of hypoglycemia  in the hospital. With this in mind, we made the following changes.  His  glyburide has been changed to 5 mg b.i.d. He has been asked to check his  blood sugars regularly at home and follow up with Dr. Karilyn Cota who will make  further adjustments.   Problem 3.  Coronary artery disease. This was stable in the hospital.   Problem 4. Diabetes. The patient's hemoglobin A1C was found to be 6.9  indicating that he is getting relatively okay control at home.   Problem 5. Chronic kidney disease. His BUN and creatinine were stable this  admission.   Problem 6. Chronic left lower extremity edema. Due to lower extremity edema  the patient had lower extremity Dopplers performed to rule out DVT as the  possible cause of this prolonged swelling. However no evidence of DVT from  the lower extremity Dopplers. He is on Coumadin for PE as indicated. His INR  is 2.0 and is therapeutic.      Lonia Blood, M.D.  Electronically Signed     LG/MEDQ  D:  09/01/2005  T:  09/01/2005  Job:  [id number]

## 2011-03-20 NOTE — Discharge Summary (Signed)
NAME:  GYAN, CAMBRE NO.:  [id number]   MEDICAL RECORD NO.:  [id number]          PATIENT TYPE:  INP   LOCATION:  5710                         FACILITY:  [facility]   PHYSICIAN:  Elliot Cousin, M.D.    DATE OF BIRTH:  Oct 03, 1929   DATE OF ADMISSION:  03/06/2006  DATE OF DISCHARGE:                                 DISCHARGE SUMMARY   DISCHARGE DIAGNOSES:  1.  Ventilator dependent respiratory failure secondary to methicillin-      resistant Staphylococcus aureus and pseudomonal pneumonia and adult      respiratory disease syndrome.  2.  Bacteremia/sepsis secondary to methicillin-resistant Staphylococcus      aureus.  3.  Persistent right upper lobe and left lower lobe air space disease      (REPEAT CT SCAN OF THE CHEST NEEDED IN THREE MONTHS).  4.  Pulmonary embolism diagnosed in 1994, continues to be maintained on      Coumadin therapy.  5.  Metabolic encephalopathy.  6.  Coronary artery disease status post coronary artery bypass graft in      1995.  7.  Hypertension.  8.  Dysphagia, now resolving.  9.  Diarrhea.  10. Acute on chronic kidney disease.  11. Type 2 diabetes mellitus.  12. Anemia, status post 2 units of packed red blood cells during the      hospitalization.  13. Decubitus ulcer of the sacrum.  14. Deconditioning.   SECONDARY DISCHARGE DIAGNOSES:  1.  Status post bilateral total knee replacements.  2.  Hyperlipidemia.  3.  Chronic lower extremity edema greater on the left lower extremity than      the right with a history of thrombophlebitis.  4.  Internal and external hemorrhoids on colonoscopy in the past.  5.  Remote history of tobacco abuse.  6.  Obesity.   CURRENT MEDICATIONS:  1.  Ferrous sulfate 325 mg daily.  2.  Lipitor 20 mg q.h.s.  3.  Questran 2 g daily. (this dose may be adjusted over the next few days).  4.  Pepto-Bismol 1 tablet once or twice daily p.r.n. for diarrhea.  5.  Coumadin, dose to be determined.  6.  Metoprolol 50  mg b.i.d. (hold for systolic blood pressure less than 100      and heart rate less than 60).  7.  Multivitamin with iron once daily.  8.  Pepcid 20 mg b.i.d.  9.  Flora-Q 1 capsule daily.  10. Ensure pudding t.i.d.  11. Sliding scale insulin regimen q.a.m. and q.h.s.   HISTORY OF PRESENT ILLNESS:  The patient is a 77 year old man with a past  medical history significant for coronary artery disease, type 2 diabetes  mellitus, and pulmonary embolism, who presented to the emergency department  on Mar 06, 2006 with a chief complaint of shortness of breath and chest pain.  The patient was seen by his primary care physician and was subsequently  evaluated with an outpatient CT scan of the chest. The CT scan of the chest  revealed a dense left lower lobe and possible lingular infiltrate versus  neoplasm. The patient was advised to  report to the [facility] emergency room  because of significant respiratory distress and for further evaluation of  the CT findings.   For additional details please see the dictated history and physical.   HOSPITAL COURSE:  #1.  VENTILATOR DEPENDENT RESPIRATORY FAILURE/BILATERAL  PNEUMONIA/ARDS/SPUTUM CULTURE POSITIVE FOR MRSA AND BRONCHIAL LAVAGE  SPECIMEN POSITIVE FOR PSEUDOMONAS. The patient was started on empiric  antibiotic therapy with Rocephin and azithromycin. Oxygen therapy was  administered to keep his oxygen saturations greater than 92%. Albuterol and  Atrovent nebulizers were given every 4 hours. At the time of hospital  admission, the patient was in mild to moderate respiratory distress.  Approximately 24 hours after he was hospitalized, the patient's respiratory  status worsened and he was transferred to ICU. Subsequently, he required  mechanical intubation and ventilation. The [facility] group was  consulted. They eventually assumed the primary care of the patient  thereafter. After the patient was placed on the ventilator, he developed   persistent fevers, and worsening bilateral infiltrates on the followup chest  x-rays. A CT scan of the chest on Mar 14, 2006 revealed bilateral  infiltrates, the largest of which was consolidation of almost the entire  right upper lobe. The CT scan of the chest also revealed right lower lobe  and left lower lobe nodular infiltrates and a moderate sized bilateral  pleural effusion. Blood cultures and sputum cultures were ordered. The blood  cultures became positive approximately 24 hours later for Gram positive  cocci which were eventually identified as MRSA. The sputum also grew out  MRSA. As a consequence of these findings, Rocephin and azithromycin were  discontinued and the patient was started on antibiotic therapy with  Vancomycin and Zosyn. Over the course of the first two weeks of  hospitalization, the patient did not appear to be improving clinically. He  developed an ARDS picture. Xigris was started by the intensivist. Infectious  disease physician, Dr. Orvan Falconer, was consulted and recommended adding  rifampin. Subsequently, a bronchoscopy was performed. The bronchial lavage  specimen eventually grew out pseudomonas. The patient was therefore started  on Primaxin and gentamycin while the Zosyn was discontinued. After  sensitivities were resulted, the gentamycin was discontinued and the patient  was started on Fortaz.   Over the course of the hospitalization, the patient became less febrile and  eventually afebrile. The bilateral infiltrates began to clear some. The  patient was eventually extubated from the ventilator. Per the  recommendations of Dr. Orvan Falconer, the patient was maintained on Vancomycin  therapy for 28 days. The course of Vancomycin was completed during this  hospitalization. The patient was maintained on Fortaz for the recommended  number of days. A repeat chest x-ray on April 02, 2006 revealed persistent right upper lobe and left lower lobe air space disease. During  the initial  evaluation of the patient, there was some concern regarding whether or not  there was an underlying pulmonary neoplasm. Given the patient's recovery  from the acute pneumonia/ARDS, a repeat CT scan of the chest for followup  evaluation is favored and warranted. If the patient continues to demonstrate  radiographic findings of persistent bilateral air space disease, he may need  a diagnostic bronchoscopy or another method of diagnoses. Currently he is  oxygenating within normal limits. He is in no acute respiratory distress.   #2.  HISTORY OF PULMONARY EMBOLISM.  The patient was diagnosed with a  pulmonary embolism in 1994. However, he had been maintained on Coumadin  treatment.  The Coumadin was discontinued at the time that the patient was  ventilated. However, it has since been restarted. He is currently on full  dose Lovenox until his Coumadin becomes therapeutic at 2-3. The pharmacy  staff is currently adjusting the doses. His INR will need to be monitored  very closely following hospital discharge.   #3.  CORONARY ARTERY DISEASE WITH A HISTORY OF CABG. Over the past several  days, the patient has been stable with regards to his cardiac status. He has  no complaints of chest pain. He is currently being maintained on metoprolol  50 mg b.i.d. Lipitor will be restarted for his history of hyperlipidemia.  Will hold off on restarting aspirin given that he is chronically  anticoagulated with Coumadin.   #4.  METABOLIC ENCEPHALOPATHY. The patient developed moderate confusion and  delirium during the majority of the hospital course. This was felt to be  secondary to toxic metabolic encephalopathy. Earlier on in the hospital  course, a CT scan of the head was ordered and was negative. Several days  ago, a followup evaluation of delirium was undertaken. The results of the  lab data are as follows, ammonia level 22, well within normal limits,  vitamin B12 821, within normal limits,  RBC folate 1395 well within normal  limits, RPR nonreactive, and the repeat CT scan of the head negative for any  acute intracranial findings. Over the past 2-3 days, the patient is  beginning to become more alert and oriented. His speech is much clearer now.  He follows directions well. He still is a little confused with regards to  time and year; however, he is now oriented to himself and to place.  Neurologically his cranial nerves are intact. Hopefully as time goes on, the  encephalopathy will completely resolve.   #5.  DYSPHAGIA. The patient was started on tube feedings via a panda tube  during his stay in the ICU. Following extubation, he failed the swallowing  study. Another followup swallowing evaluation occurred several days ago and  the patient actually passed. The speech therapist recommended a dysphagia II  (chopped) diet with thin liquids. Full supervision with his meals have been ordered. Following hospital discharge, the patient will need to be assisted  with all of his meals and fully supervised. His diet may be upgraded once  his dentures are more secured. The family will be asked to bring in another  set of dentures and/or the appropriate adhesives to secure his dentures more  firmly.   #6.  HYPERTENSION. The patient's blood pressures have been well-controlled  on metoprolol 50 mg b.i.d.   #7.  TYPE 2 DIABETES MELLITUS. The patient's CBGs/glycemia had been  uncontrolled up until recently. Once the tube feedings were discontinued,  his capillary blood sugars have been ranging from 88 to 150. He had been  treated with a combination of Lantus and a sliding scale insulin regimen.  Both have now been discontinued. However as the patient's intake improves, a  sliding scale insulin regimen with NovoLog at least twice a day should be  restarted.   #8.  ACUTE ON CHRONIC KIDNEY DISEASE. The patient developed acute renal  failure during his stay in the ICU. Following  extubation, his renal function  has progressively improved. As of today, his BUN is 26 and his creatinine is  1.5. Per the history in the past, his baseline creatinine is 1.2.   #9.  ANEMIA. The patient's hemoglobin fell to a nadir of 7.5 during the  hospital course. He was transfused 2 units of packed red blood cells. His  hemoglobin has remained stable over the past several days. It is now 9.5.  The patient has been started on a multivitamin with iron once daily as well  as ferrous sulfate 325 mg daily.   #10.  PERSISTENT DIARRHEA. The patient has experienced multiple loose and  runny diarrheal stools. While he was in the ICU, a rectal tube was placed.  He was also started on treatment with Questran twice daily. Multiple stool  specimens have been sent and they have all been negative for C Dif. The  stool culture is currently pending. There was no evidence of fecal WBCs. In  addition to the Questran, Pepto-Bismol was started several days ago.  Hopefully the addition of Pepto-Bismol will help to solidify his stools. The  patient still has the rectal tube inserted however the tube itself actually  may be contributing to the problem. A trial of discontinuing the rectal tube  will be started today. Further monitoring and adjustments in the  antidiarrheal medications will be made accordingly.   #11.  STAGE 1 TO STAGE 2 SACRAL DECUBITUS ULCER. The patient is being  treated with frequent turns and barrier creams and ointments. A wound care  facilitator will be consulted for further evaluation and management.   #12.  DECONDITIONING. The patient is very deconditioned. He will need to be  placed in a skilled nursing facility for ongoing physical and occupational  therapy. He is now approaching medical stability and hopefully next week, he  will be medically ready for hospital discharge. Acquisition of a skilled  nursing facility bed is pending.  DISPOSITION:  The patient is currently stable  and improved. As mentioned  above, he will need to be discharged to a skilled nursing facility for  ongoing therapy; if the patient and his family agree.  Following hospital  discharge, he will need to be assisted with all of his meals. He is  currently on a dysphagia II, thin liquid diet. Wound care will need to be  continued to avoid further breakdown of his decubitus ulcer. His INR/PT will  need to be monitored frequently. The adjustments in the doses of Coumadin  will be deferred to the patient's physician at the skilled nursing facility.  The patient may or may not need to be discharged on Lovenox. Currently his  INR is subtherapeutic.   This is an interim summary. An addendum will be dictated accordingly.      Elliot Cousin, M.D.  Electronically Signed     DF/MEDQ  D:  04/05/2006  T:  04/05/2006  Job:  [id number]   cc:   Wilson Singer, M.D.  Fax: [phone]   Peter M. [place], M.D.  Fax: [phone]

## 2011-03-20 NOTE — H&P (Signed)
NAME:  Charles Guzman, Charles Guzman NO.:  [id number]   MEDICAL RECORD NO.:  [id number]          PATIENT TYPE:  OBV   LOCATION:  2039                         FACILITY:  [facility]   PHYSICIAN:  Lonia Blood, M.D.       DATE OF BIRTH:  25-Nov-1928   DATE OF ADMISSION:  08/30/2005  DATE OF DISCHARGE:                                HISTORY & PHYSICAL   PRIMARY CARE PHYSICIAN:  Dr. Karilyn Cota   PRIMARY CARDIOLOGIST:  Dr. Peter [place]   CHIEF COMPLAINT:  Altered mental status.   HISTORY OF PRESENT ILLNESS:  Charles Guzman is a 77 year old man with history  of coronary artery disease and diabetes who was brought to the emergency  room by his daughter.  This morning Charles Guzman woke up in his usual state  of health but he then had an episode of passing out and he does not remember  what happened for about four hours this morning.  He called his daughter and  he was lying on the floor and he needed help to get up.  Apparently three  days prior to admission Charles Guzman had an episode of hypoglycemia and he  was seen by the EMS services and was given intravenous glucose to  recuperate.  Patient's medications have not been changed recently and he  also denies any chest pain or dyspnea.  No nausea.  No vomiting.  Not  feeling sick.   PAST MEDICAL HISTORY:  1.  Pulmonary embolism in 1994.  2.  Coronary artery disease.  3.  Coronary artery bypass grafting 1995.  4.  Diabetes mellitus.  5.  Bilateral total knee replacement.  6.  Hyperlipidemia.  7.  Hypertension.   HOME MEDICATIONS:  Metformin, Glyburide, Coumadin, Altace, Lipitor, and  fenofibrate.  Doses unknown.   Patient has no known drug allergies.   SOCIAL HISTORY:  He is a former tobacco user, but quit a while ago.  Does  not drink alcohol.  Lives alone.  He is married and his wife is in a skilled  nursing facility.  He has an only daughter that is very caring and has been  trying to take care of him.   FAMILY HISTORY:   Noncontributory.   REVIEW OF SYSTEMS:  Positive for some low extremity edema that is chronic  and after the knee replacement.  Also, very poor hearing with bilateral  hearing aids and a 40 pound weight loss over the past six months.  All other  systems are negative.   PHYSICAL EXAMINATION:  VITAL SIGNS:  Temperature 98.3, blood pressure  155/85, heart rate 90, respirations 20, saturation 100% on room air.  GENERAL:  Patient appears well-developed, well-nourished, in no acute  distress sitting in bed, alert and oriented place, person, time.  Extremely  hard of hearing.  HEENT:  Head is normocephalic, atraumatic.  Eyes are pupil equal and round,  react to light and accommodation.  Extraocular movements are intact.  Sclerae anicteric.  Throat is clear.  NECK:  Supple, without JVD.  No carotid bruits.  LUNGS:  Clear to auscultation bilaterally without wheezes, rhonchi, or  crackles.  HEART:  Regular rate and rhythm without murmurs, rubs, or gallops.  ABDOMEN:  Soft, nontender, nondistended.  Bowel sounds are present.  SKIN:  Warm and dry.  There is no suspicious rashes.  EXTREMITIES:  His left lower extremity has 3+ edema that per the patient  apparently is chronic.  His knees have bilateral scars secondary to the knee  replacement surgeries.  NEUROLOGIC:  Cranial nerves II-XII are intact.  Strength 5/5 in all four  extremities.  Sensation is intact.  LYMPHATICS:  There is no palpable lymphadenopathy.   ADMISSION LABORATORIES:  Sodium is 138, potassium 4.3, chloride 106,  bicarbonate 25, BUN 16, creatinine 1.2, glucose 128.  PT 24.3, INR 2.2.  Urinalysis is within normal limits.  Head CT shows atrophy and no acute  changes.  White blood cell count of 6.5, hemoglobin 12.4, platelet count  206.   ASSESSMENT/PLAN:  1.  Probable syncope.  Will admit the patient for observation to the      hospital.  Will place him on telemetry.  Rule out myocardial infarction      by cardiac enzymes.   Will obtain a 2-D echocardiogram, check for the      ejection fraction as it is well known that low ejection fraction is      predictive of malignant ventricular arrhythmias.  Will put the patient      on aspirin while in the hospital.  2.  History of pulmonary embolism with chronic lower extremity edema.  Will      check a D-dimer and check left lower extremity venous ultrasound.  If      there is any residual clot in the left lower extremity consideration      should be given to placement of an inferior vena cava filter.  Will      continue the Coumadin full dose while the patient is in the hospital.  3.  Diabetes mellitus with history of hypoglycemia.  I will hold Metformin      and Glyburide while in the hospital.  Check hemoglobin A1c and check the      CBGs every four hours.  Plan is to discharge this patient on Metformin      alone as Glyburide is a well-known culprit to cause hypoglycemia in the      elderly.  4.  Acute renal insufficiency.  Will hold the ACE inhibitor, recheck      creatinine tomorrow morning.  5.  Gastrointestinal prophylaxis using Protonix.      Lonia Blood, M.D.  Electronically Signed     SL/MEDQ  D:  08/30/2005  T:  08/31/2005  Job:  [id number]   cc:   Wilson Singer, M.D.  Fax: [phone]

## 2011-04-06 ENCOUNTER — Other Ambulatory Visit: Payer: Self-pay | Admitting: Internal Medicine

## 2011-04-12 ENCOUNTER — Other Ambulatory Visit: Payer: Self-pay | Admitting: Internal Medicine

## 2011-04-12 ENCOUNTER — Ambulatory Visit
Admission: RE | Admit: 2011-04-12 | Discharge: 2011-04-12 | Disposition: A | Discharge status: Home / Self Care | Payer: Medicare Other | Source: Ambulatory Visit | Attending: Internal Medicine | Admitting: Internal Medicine

## 2011-05-18 ENCOUNTER — Ambulatory Visit (HOSPITAL_COMMUNITY)
Admission: RE | Admit: 2011-05-18 | Discharge: 2011-05-18 | Disposition: A | Discharge status: Home / Self Care | Payer: Medicare Other | Source: Ambulatory Visit | Attending: Gastroenterology | Admitting: Gastroenterology

## 2011-05-18 ENCOUNTER — Ambulatory Visit (HOSPITAL_COMMUNITY): Payer: Medicare Other

## 2011-05-18 ENCOUNTER — Other Ambulatory Visit: Payer: Self-pay | Admitting: Gastroenterology

## 2011-05-18 DIAGNOSIS — Z01812 Encounter for preprocedural laboratory examination: Secondary | ICD-10-CM | POA: Insufficient documentation

## 2011-05-18 DIAGNOSIS — Z86711 Personal history of pulmonary embolism: Secondary | ICD-10-CM | POA: Insufficient documentation

## 2011-05-18 DIAGNOSIS — K449 Diaphragmatic hernia without obstruction or gangrene: Secondary | ICD-10-CM | POA: Insufficient documentation

## 2011-05-18 DIAGNOSIS — K2940 Chronic atrophic gastritis without bleeding: Secondary | ICD-10-CM | POA: Insufficient documentation

## 2011-05-18 DIAGNOSIS — I10 Essential (primary) hypertension: Secondary | ICD-10-CM | POA: Insufficient documentation

## 2011-05-18 DIAGNOSIS — Z794 Long term (current) use of insulin: Secondary | ICD-10-CM | POA: Insufficient documentation

## 2011-05-18 DIAGNOSIS — R1310 Dysphagia, unspecified: Secondary | ICD-10-CM | POA: Insufficient documentation

## 2011-05-18 DIAGNOSIS — E119 Type 2 diabetes mellitus without complications: Secondary | ICD-10-CM | POA: Insufficient documentation

## 2011-05-18 DIAGNOSIS — I2510 Atherosclerotic heart disease of native coronary artery without angina pectoris: Secondary | ICD-10-CM | POA: Insufficient documentation

## 2011-05-18 DIAGNOSIS — K219 Gastro-esophageal reflux disease without esophagitis: Secondary | ICD-10-CM | POA: Insufficient documentation

## 2011-05-18 DIAGNOSIS — Z01818 Encounter for other preprocedural examination: Secondary | ICD-10-CM | POA: Insufficient documentation

## 2011-05-18 DIAGNOSIS — E7800 Pure hypercholesterolemia, unspecified: Secondary | ICD-10-CM | POA: Insufficient documentation

## 2011-05-18 DIAGNOSIS — Z7901 Long term (current) use of anticoagulants: Secondary | ICD-10-CM | POA: Insufficient documentation

## 2011-05-18 DIAGNOSIS — K5900 Constipation, unspecified: Secondary | ICD-10-CM | POA: Insufficient documentation

## 2011-05-18 LAB — GLUCOSE, CAPILLARY: Glucose-Capillary: 186 mg/dL — ABNORMAL HIGH (ref 70–99)

## 2011-06-05 NOTE — Op Note (Signed)
NAME:  Charles Guzman, JOINER.:  [id number]  MEDICAL RECORD NO.:  [id number]  LOCATION:  [facility]                         FACILITY:  [facility]  PHYSICIAN:  Petra Kuba, M.D.    DATE OF BIRTH:  January 10, 1929  DATE OF PROCEDURE:  05/18/2011 DATE OF DISCHARGE:                              OPERATIVE REPORT   PROCEDURE:  EGD with Savary dilatation and biopsy.  INDICATIONS:  The patient with upper tract symptoms, dysphagia, abnormal swallowing study.  Consent was signed after risks, benefits, methods, options thoroughly discussed with the patient and his daughter multiple times in the past.  MEDICINES USED:  Fentanyl 25 mcg, Versed 2.5 mg.  DESCRIPTION OF PROCEDURE:  The video endoscope was inserted by direct vision.  Quick look at the vocal cords were normal.  The proximal mid- esophagus was normal and distal esophagus was a widely patent fibrous ring and a tiny hiatal hernia.  Scope passed easily into the stomach. We then slowly withdrew back through the esophagus, back to the upper esophageal sphincter which confirmed above findings.  No significant abnormalities were seen.  Scope was then advanced through a normal antrum, normal pylorus, into a normal duodenal bulb around the C-loop to a normal second portion of the duodenum.  Scope was slowly withdrawn back to the bulb and a good look there ruled out ulcers in that location.  Scope was withdrawn back to the stomach and retroflexed. Cardia and fundus were normal as was the lesser curve in the angularis along the distal greater curve was a patch of moderate gastritis which was biopsied at the end of the procedure after the dilation.  Straight visualization of the stomach did not reveal any additional findings.  We went ahead and withdrew through the esophagus which confirmed above findings and then advanced to the antrum and under both endoscopic and fluoro guidance, the Savary wire was advanced and the customary J-loop of  the wire was confirmed in the antrum, both endoscopically and under fluoro.  While the scope was removed, the wire was watched under fluoro and stayed in the proper position.  Once the scope was removed, we proceeded with the Savary 15 and then 16 mm dilators and both were confirmed in the proper position in the stomach under fluoro.  There was no resistance in passing either dilator and no heme on withdrawal from each dilator.  Once the 16 was confirmed in the stomach, the wire was withdrawn back into the dilator and both were removed in tandem.  We went ahead and reinserted the endoscope.  There was very minimal trauma in the upper esophagus and very minimal at the ring without active bleeding.  We went ahead and took our biopsies of the gastritis and then slowly withdrew.  The ring was washed and watched and could not be made to bleed.  No obvious significant complication and on slow withdrawal, there was minimal trauma to the upper esophagus as mentioned above, but no active bleeding.  Scope was withdrawn.  The patient tolerated the procedure well.  There was no obvious immediate complication.  ENDOSCOPIC DIAGNOSES: 1. Tiny hiatal hernia with a widely patent fibrous ring. 2. Patch of distal greater curve gastritis,  biopsied at the end of the     procedure. 3. Otherwise, within normal limits EGD therapy, Savary dilatation to     16 mm under fluoro without heme on the dilators or resistance in     passing without any active bleeding at the end of the procedure.  PLAN:  See how this helps.  Continue Aciphex.  Followup p.r.n. or in 6 weeks.  Will touch base with him when we review the biopsies and will resume Coumadin tomorrow as long as no delayed complications.          ______________________________ Petra Kuba, M.D.     MEM/MEDQ  D:  05/18/2011  T:  05/18/2011  Job:  [id number]  Electronically Signed by Vida Rigger M.D. on 06/05/2011 04:34:29 PM

## 2011-07-11 ENCOUNTER — Other Ambulatory Visit: Payer: Self-pay | Admitting: Dermatology

## 2011-08-30 DIAGNOSIS — J189 Pneumonia, unspecified organism: Secondary | ICD-10-CM

## 2011-08-30 HISTORY — DX: Pneumonia, unspecified organism: J18.9

## 2011-09-09 ENCOUNTER — Inpatient Hospital Stay (HOSPITAL_COMMUNITY)
Admission: EM | Admit: 2011-09-09 | Discharge: 2011-09-12 | DRG: 193 | Disposition: A | Discharge status: Home Health | Payer: Medicare Other | Source: Ambulatory Visit | Attending: Internal Medicine | Admitting: Internal Medicine

## 2011-09-09 DIAGNOSIS — E860 Dehydration: Secondary | ICD-10-CM | POA: Diagnosis present

## 2011-09-09 DIAGNOSIS — R4182 Altered mental status, unspecified: Secondary | ICD-10-CM

## 2011-09-09 DIAGNOSIS — N058 Unspecified nephritic syndrome with other morphologic changes: Secondary | ICD-10-CM | POA: Diagnosis present

## 2011-09-09 DIAGNOSIS — T45515A Adverse effect of anticoagulants, initial encounter: Secondary | ICD-10-CM

## 2011-09-09 DIAGNOSIS — I129 Hypertensive chronic kidney disease with stage 1 through stage 4 chronic kidney disease, or unspecified chronic kidney disease: Secondary | ICD-10-CM | POA: Diagnosis present

## 2011-09-09 DIAGNOSIS — G92 Toxic encephalopathy: Secondary | ICD-10-CM | POA: Diagnosis present

## 2011-09-09 DIAGNOSIS — I2510 Atherosclerotic heart disease of native coronary artery without angina pectoris: Secondary | ICD-10-CM | POA: Diagnosis present

## 2011-09-09 DIAGNOSIS — N189 Chronic kidney disease, unspecified: Secondary | ICD-10-CM | POA: Diagnosis present

## 2011-09-09 DIAGNOSIS — G929 Unspecified toxic encephalopathy: Secondary | ICD-10-CM | POA: Diagnosis present

## 2011-09-09 DIAGNOSIS — N289 Disorder of kidney and ureter, unspecified: Secondary | ICD-10-CM

## 2011-09-09 DIAGNOSIS — J189 Pneumonia, unspecified organism: Principal | ICD-10-CM | POA: Diagnosis present

## 2011-09-09 DIAGNOSIS — N179 Acute kidney failure, unspecified: Secondary | ICD-10-CM | POA: Diagnosis present

## 2011-09-09 DIAGNOSIS — J159 Unspecified bacterial pneumonia: Secondary | ICD-10-CM

## 2011-09-09 DIAGNOSIS — I951 Orthostatic hypotension: Secondary | ICD-10-CM

## 2011-09-09 DIAGNOSIS — K219 Gastro-esophageal reflux disease without esophagitis: Secondary | ICD-10-CM | POA: Diagnosis present

## 2011-09-09 DIAGNOSIS — E1129 Type 2 diabetes mellitus with other diabetic kidney complication: Secondary | ICD-10-CM | POA: Diagnosis present

## 2011-09-09 DIAGNOSIS — D649 Anemia, unspecified: Secondary | ICD-10-CM | POA: Diagnosis present

## 2011-09-09 HISTORY — DX: Essential (primary) hypertension: I10

## 2011-09-09 HISTORY — DX: Chronic kidney disease, unspecified: N18.9

## 2011-09-09 HISTORY — DX: Atherosclerotic heart disease of native coronary artery without angina pectoris: I25.10

## 2011-09-09 HISTORY — DX: Gastro-esophageal reflux disease without esophagitis: K21.9

## 2011-09-09 HISTORY — DX: Anemia, unspecified: D64.9

## 2011-09-09 HISTORY — DX: Altered mental status, unspecified: R41.82

## 2011-09-09 HISTORY — DX: Pneumonia, unspecified organism: J18.9

## 2011-09-09 NOTE — ED Notes (Signed)
Family reports the patient has been getting progessively confused and they are concerned and wants him evaluated.

## 2011-09-10 ENCOUNTER — Other Ambulatory Visit: Payer: Self-pay

## 2011-09-10 ENCOUNTER — Observation Stay (HOSPITAL_COMMUNITY): Payer: Medicare Other

## 2011-09-10 ENCOUNTER — Emergency Department (HOSPITAL_COMMUNITY): Payer: Medicare Other

## 2011-09-10 ENCOUNTER — Encounter: Payer: Self-pay | Admitting: *Deleted

## 2011-09-10 DIAGNOSIS — G929 Unspecified toxic encephalopathy: Secondary | ICD-10-CM

## 2011-09-10 DIAGNOSIS — I951 Orthostatic hypotension: Secondary | ICD-10-CM

## 2011-09-10 DIAGNOSIS — G92 Toxic encephalopathy: Secondary | ICD-10-CM

## 2011-09-10 DIAGNOSIS — I2510 Atherosclerotic heart disease of native coronary artery without angina pectoris: Secondary | ICD-10-CM

## 2011-09-10 DIAGNOSIS — J189 Pneumonia, unspecified organism: Secondary | ICD-10-CM | POA: Diagnosis present

## 2011-09-10 DIAGNOSIS — N189 Chronic kidney disease, unspecified: Secondary | ICD-10-CM

## 2011-09-10 DIAGNOSIS — E1129 Type 2 diabetes mellitus with other diabetic kidney complication: Secondary | ICD-10-CM | POA: Diagnosis present

## 2011-09-10 DIAGNOSIS — N179 Acute kidney failure, unspecified: Secondary | ICD-10-CM | POA: Diagnosis present

## 2011-09-10 DIAGNOSIS — E860 Dehydration: Secondary | ICD-10-CM | POA: Diagnosis present

## 2011-09-10 DIAGNOSIS — D6832 Hemorrhagic disorder due to extrinsic circulating anticoagulants: Secondary | ICD-10-CM

## 2011-09-10 DIAGNOSIS — D631 Anemia in chronic kidney disease: Secondary | ICD-10-CM

## 2011-09-10 LAB — URINALYSIS, ROUTINE W REFLEX MICROSCOPIC
Glucose, UA: NEGATIVE mg/dL
Leukocytes, UA: NEGATIVE
Nitrite: NEGATIVE
Specific Gravity, Urine: 1.012 (ref 1.005–1.030)
pH: 5 (ref 5.0–8.0)

## 2011-09-10 LAB — CBC
HCT: 35.8 % — ABNORMAL LOW (ref 39.0–52.0)
Hemoglobin: 11.7 g/dL — ABNORMAL LOW (ref 13.0–17.0)
Hemoglobin: 12.1 g/dL — ABNORMAL LOW (ref 13.0–17.0)
MCH: 30.9 pg (ref 26.0–34.0)
MCHC: 33.7 g/dL (ref 30.0–36.0)
Platelets: 127 10*3/uL — ABNORMAL LOW (ref 150–400)
RBC: 3.91 MIL/uL — ABNORMAL LOW (ref 4.22–5.81)
WBC: 7.1 10*3/uL (ref 4.0–10.5)

## 2011-09-10 LAB — COMPREHENSIVE METABOLIC PANEL
ALT: 8 U/L (ref 0–53)
AST: 13 U/L (ref 0–37)
Albumin: 3.6 g/dL (ref 3.5–5.2)
Alkaline Phosphatase: 81 U/L (ref 39–117)
Alkaline Phosphatase: 75 U/L (ref 39–117)
BUN: 32 mg/dL — ABNORMAL HIGH (ref 6–23)
BUN: 32 mg/dL — ABNORMAL HIGH (ref 6–23)
CO2: 25 mEq/L (ref 19–32)
CO2: 25 mEq/L (ref 19–32)
Calcium: 9.1 mg/dL (ref 8.4–10.5)
Chloride: 101 mEq/L (ref 96–112)
Creatinine, Ser: 2.93 mg/dL — ABNORMAL HIGH (ref 0.50–1.35)
GFR calc Af Amer: 22 mL/min — ABNORMAL LOW (ref >90)
GFR calc non Af Amer: 19 mL/min — ABNORMAL LOW (ref >90)
GFR calc non Af Amer: 19 mL/min — ABNORMAL LOW (ref >90)
Glucose, Bld: 118 mg/dL — ABNORMAL HIGH (ref 70–99)
Potassium: 4.7 mEq/L (ref 3.5–5.1)
Potassium: 4.5 mEq/L (ref 3.5–5.1)
Sodium: 136 mEq/L (ref 135–145)
Total Bilirubin: 0.6 mg/dL (ref 0.3–1.2)
Total Protein: 7.1 g/dL (ref 6.0–8.3)

## 2011-09-10 LAB — DIFFERENTIAL
Basophils Absolute: 0 10*3/uL (ref 0.0–0.1)
Basophils Relative: 1 % (ref 0–1)
Basophils Relative: 1 % (ref 0–1)
Eosinophils Absolute: 0.2 10*3/uL (ref 0.0–0.7)
Lymphocytes Relative: 24 % (ref 12–46)
Monocytes Absolute: 0.5 10*3/uL (ref 0.1–1.0)
Monocytes Relative: 7 % (ref 3–12)
Neutro Abs: 4.3 10*3/uL (ref 1.7–7.7)
Neutro Abs: 4.6 10*3/uL (ref 1.7–7.7)
Neutrophils Relative %: 60 % (ref 43–77)
Neutrophils Relative %: 65 % (ref 43–77)

## 2011-09-10 LAB — PROTIME-INR
INR: 3.95 — ABNORMAL HIGH (ref 0.00–1.49)
Prothrombin Time: 39.2 seconds — ABNORMAL HIGH (ref 11.6–15.2)

## 2011-09-10 LAB — GLUCOSE, CAPILLARY
Glucose-Capillary: 175 mg/dL — ABNORMAL HIGH (ref 70–99)
Glucose-Capillary: 151 mg/dL — ABNORMAL HIGH (ref 70–99)

## 2011-09-10 LAB — MAGNESIUM: Magnesium: 2.2 mg/dL (ref 1.5–2.5)

## 2011-09-10 LAB — RAPID HIV SCREEN (WH-MAU): Rapid HIV Screen: NONREACTIVE

## 2011-09-10 MED ORDER — TRAMADOL HCL 50 MG PO TABS
50.0000 mg | ORAL_TABLET | Freq: Two times a day (BID) | ORAL | Status: DC
  Administered 2011-09-11 – 2011-09-12 (×2): 50 mg via ORAL
  Filled 2011-09-10 (×2): qty 1

## 2011-09-10 MED ORDER — DEXTROSE 5 % IV SOLN
500.0000 mg | INTRAVENOUS | Status: DC
  Administered 2011-09-10 – 2011-09-11 (×2): 500 mg via INTRAVENOUS
  Filled 2011-09-10 (×5): qty 500

## 2011-09-10 MED ORDER — SODIUM CHLORIDE 0.9 % IV BOLUS (SEPSIS)
1000.0000 mL | Freq: Once | INTRAVENOUS | Status: AC
  Administered 2011-09-10: 1000 mL via INTRAVENOUS

## 2011-09-10 MED ORDER — DEXTROSE 5 % IV SOLN
1.0000 g | Freq: Once | INTRAVENOUS | Status: AC
  Administered 2011-09-10 (×2): 1 g via INTRAVENOUS
  Filled 2011-09-10: qty 10

## 2011-09-10 MED ORDER — METOPROLOL SUCCINATE ER 50 MG PO TB24
50.0000 mg | ORAL_TABLET | Freq: Two times a day (BID) | ORAL | Status: DC
  Administered 2011-09-10: 50 mg via ORAL
  Filled 2011-09-10 (×2): qty 1

## 2011-09-10 MED ORDER — DEXTROSE 5 % IV SOLN: 500.0000 mg | Freq: Once | INTRAVENOUS | Status: DC

## 2011-09-10 MED ORDER — SODIUM CHLORIDE 0.9 % IV SOLN
INTRAVENOUS | Status: DC
  Administered 2011-09-10 – 2011-09-12 (×2): via INTRAVENOUS

## 2011-09-10 MED ORDER — DEXTROSE 5 % IV SOLN
1.0000 g | INTRAVENOUS | Status: DC
  Administered 2011-09-11 – 2011-09-12 (×2): 1 g via INTRAVENOUS
  Filled 2011-09-10 (×2): qty 10

## 2011-09-10 MED ORDER — INSULIN ASPART 100 UNIT/ML ~~LOC~~ SOLN
0.0000 [IU] | Freq: Three times a day (TID) | SUBCUTANEOUS | Status: DC
  Administered 2011-09-10: 1 [IU] via SUBCUTANEOUS
  Administered 2011-09-10: 2 [IU] via SUBCUTANEOUS
  Administered 2011-09-11: 5 [IU] via SUBCUTANEOUS
  Filled 2011-09-10: qty 3

## 2011-09-10 MED ORDER — METOPROLOL SUCCINATE ER 50 MG PO TB24
50.0000 mg | ORAL_TABLET | Freq: Two times a day (BID) | ORAL | Status: DC
  Administered 2011-09-11 – 2011-09-12 (×2): 50 mg via ORAL
  Filled 2011-09-10 (×4): qty 1

## 2011-09-10 NOTE — H&P (Signed)
Charles Guzman is an 77 y.o. male.   Chief Complaint: Weakness and confusion HPI: Patient is an 77 year old male who according to his wife began getting sick on Wednesday. She states that he has been confused. He is very lethargic He has been sleeping all day and all night.  He has been very weak he has had difficulty walking which he usually does with a cane. The patient's wife states that he also has difficulty standing. In the last 2 days he has asked for a chair in the shower so he did not have to stand. The patient's wife states she has to help him in and out of the shower. She states that he has had a poor appetite and has had poor by mouth intake over the past few days.  According to the wife, the patient has not had any fevers or chills, cough, shortness of breath, or wheezing. She states that for years the patient has slept in a recliner because he is unable to lie flat.  All of the history is taken from the wife and daughter. The patient himself is lethargic he is arousable but is really unable to answer any questions he is extremely hard of hearing and confused. He does deny any pain.  Past Medical History  Diagnosis Date  . Chronic kidney disease   . Coronary artery disease   . Hypertension   . GERD (gastroesophageal reflux disease)   . Pneumonia   . Diabetes mellitus   . Anemia     Past Surgical History  Procedure Date  . Coronary artery bypass graft   . Joint replacement     Family History  Problem Relation Age of Onset  . Coronary artery disease Father   . Coronary artery disease Brother    Social History:  does not have a smoking history on file. His smokeless tobacco use includes Chew. He reports that he does not drink alcohol or use illicit drugs. Medications Prior to Admission  Medication Dose Route Frequency Provider Last Rate Last Dose  . 0.9 %  sodium chloride infusion   Intravenous Continuous Chanley Mcenery      . azithromycin (ZITHROMAX) 500 mg in dextrose 5  % 250 mL IVPB  500 mg Intravenous Once Sunnie Nielsen, MD      . azithromycin (ZITHROMAX) 500 mg in dextrose 5 % 250 mL IVPB  500 mg Intravenous Q24H Andersen Mckiver      . cefTRIAXone (ROCEPHIN) 1 g in dextrose 5 % 50 mL IVPB  1 g Intravenous Once Sunnie Nielsen, MD   1 g at 09/10/11 0220  . cefTRIAXone (ROCEPHIN) 1 g in dextrose 5 % 50 mL IVPB  1 g Intravenous Q24H Shaniyah Wix      . insulin aspart (novoLOG) injection 0-9 Units  0-9 Units Subcutaneous TID WC Adilson Grafton      . metoprolol (TOPROL-XL) 24 hr tablet 50 mg  50 mg Oral BID Kotaro Buer      . sodium chloride 0.9 % bolus 1,000 mL  1,000 mL Intravenous Once Sunnie Nielsen, MD   1,000 mL at 09/10/11 0211  . traMADol (ULTRAM) tablet 50 mg  50 mg Oral BID Jedidiah Demartini       Medications Prior to Admission  Medication Sig Dispense Refill  . amitriptyline (ELAVIL) 10 MG tablet Take 10 mg by mouth at bedtime. Depression        . atorvastatin (LIPITOR) 20 MG tablet Take 20 mg by mouth daily.        Marland Kitchen  furosemide (LASIX) 40 MG tablet Take 40 mg by mouth 2 (two) times daily.        Marland Kitchen lisinopril (PRINIVIL,ZESTRIL) 10 MG tablet Take 10 mg by mouth daily.        . metoprolol (TOPROL-XL) 50 MG 24 hr tablet Take 50 mg by mouth 2 (two) times daily.        . potassium chloride (KLOR-CON) 10 MEQ CR tablet Take 10 mEq by mouth daily.        . ranitidine (ZANTAC) 300 MG tablet Take 300 mg by mouth daily.        . sitaGLIPtin (JANUVIA) 100 MG tablet Take 100 mg by mouth daily.        . traMADol (ULTRAM) 50 MG tablet Take 50 mg by mouth 2 (two) times daily. Maximum dose= 8 tablets per day       . warfarin (COUMADIN) 3 MG tablet Take 3 mg by mouth daily. Daughter states he takes 3mg  tab every day except Saturday and Sunday he takes two of the 3mg  tab.         Allergies: No Known Allergies  Constitutional: positive for chills, fatigue and malaise, negative for chills, fevers and sweats Eyes: positive for redness, negative for icterus, irritation and visual  disturbance Ears, nose, mouth, throat, and face: negative for earaches, nasal congestion and sore throat Respiratory: negative for cough, dyspnea on exertion, sputum and wheezing Cardiovascular: positive for orthopnea, negative for chest pain, chest pressure/discomfort, claudication, irregular heart beat and palpitations Gastrointestinal: positive for constipation, diarrhea, nausea and vomiting Genitourinary:positive for decreased stream, frequency and hesitancy, negative for dysuria, hematuria and urinary incontinence Integument/breast: negative for dryness, pruritus, rash and skin color change Hematologic/lymphatic: positive for easy bruising, negative for bleeding, lymphadenopathy and petechiae Musculoskeletal:positive for arthralgias Neurological: negative for coordination problems, seizures and vertigo Behavioral/Psych: positive for fatigue   General appearance: appears stated age, no distress, mildly obese, slowed mentation and lethargic. Head: Normocephalic, without obvious abnormality, atraumatic Eyes: conjunctivae/corneas clear. PERRL, EOM's intact. Fundi benign. Throat: lips, mucosa, and tongue normal; teeth and gums normal Neck: no adenopathy, no carotid bruit, no JVD, supple, symmetrical, trachea midline and thyroid not enlarged, symmetric, no tenderness/mass/nodules Resp: clear to auscultation bilaterally and normal percussion bilaterally Chest wall: no tenderness Cardio: regular rate and rhythm, S1, S2 normal, no murmur, click, rub or gallop and normal apical impulse GI: soft, non-tender; bowel sounds normal; no masses,  no organomegaly Extremities: extremities normal, atraumatic, no cyanosis or edema and Homans sign is negative, no sign of DVT Pulses: 2+ and symmetric Skin: Skin color, texture, turgor normal. No rashes or lesions Lymph nodes: Cervical, supraclavicular, and axillary nodes normal. Neurologic: Mental status: Alert, oriented, thought content appropriate,  alertness: lethargic, orientation: person, affect: blunted   Results for orders placed during the hospital encounter of 09/09/11 (from the past 48 hour(s))  CBC     Status: Abnormal   Collection Time   09/10/11 12:19 AM      Component Value Range Comment   WBC 7.1  4.0 - 10.5 (K/uL)    RBC 3.91 (*) 4.22 - 5.81 (MIL/uL)    Hemoglobin 12.1 (*) 13.0 - 17.0 (g/dL)    HCT 14.7 (*) 82.9 - 52.0 (%)    MCV 91.6  78.0 - 100.0 (fL)    MCH 30.9  26.0 - 34.0 (pg)    MCHC 33.8  30.0 - 36.0 (g/dL)    RDW 56.2  13.0 - 86.5 (%)    Platelets 139 (*) 150 -  400 (K/uL)   DIFFERENTIAL     Status: Normal   Collection Time   09/10/11 12:19 AM      Component Value Range Comment   Neutrophils Relative 65  43 - 77 (%)    Neutro Abs 4.6  1.7 - 7.7 (K/uL)    Lymphocytes Relative 24  12 - 46 (%)    Lymphs Abs 1.7  0.7 - 4.0 (K/uL)    Monocytes Relative 7  3 - 12 (%)    Monocytes Absolute 0.5  0.1 - 1.0 (K/uL)    Eosinophils Relative 3  0 - 5 (%)    Eosinophils Absolute 0.2  0.0 - 0.7 (K/uL)    Basophils Relative 1  0 - 1 (%)    Basophils Absolute 0.0  0.0 - 0.1 (K/uL)   COMPREHENSIVE METABOLIC PANEL     Status: Abnormal   Collection Time   09/10/11 12:20 AM      Component Value Range Comment   Sodium 136  135 - 145 (mEq/L)    Potassium 4.7  3.5 - 5.1 (mEq/L)    Chloride 101  96 - 112 (mEq/L)    CO2 25  19 - 32 (mEq/L)    Glucose, Bld 118 (*) 70 - 99 (mg/dL)    BUN 32 (*) 6 - 23 (mg/dL)    Creatinine, Ser 1.61 (*) 0.50 - 1.35 (mg/dL)    Calcium 9.5  8.4 - 10.5 (mg/dL)    Total Protein 7.2  6.0 - 8.3 (g/dL)    Albumin 3.6  3.5 - 5.2 (g/dL)    AST 13  0 - 37 (U/L)    ALT 8  0 - 53 (U/L)    Alkaline Phosphatase 81  39 - 117 (U/L)    Total Bilirubin 0.6  0.3 - 1.2 (mg/dL)    GFR calc non Af Amer 19 (*) >90 (mL/min)    GFR calc Af Amer 22 (*) >90 (mL/min)   PROTIME-INR     Status: Abnormal   Collection Time   09/10/11  1:00 AM      Component Value Range Comment   Prothrombin Time 39.2 (*) 11.6 -  15.2 (seconds)    INR 3.95 (*) 0.00 - 1.49    URINALYSIS, ROUTINE W REFLEX MICROSCOPIC     Status: Abnormal   Collection Time   09/10/11  2:00 AM      Component Value Range Comment   Color, Urine YELLOW  YELLOW     Appearance CLOUDY (*) CLEAR     Specific Gravity, Urine 1.012  1.005 - 1.030     pH 5.0  5.0 - 8.0     Glucose, UA NEGATIVE  NEGATIVE (mg/dL)    Hgb urine dipstick NEGATIVE  NEGATIVE     Bilirubin Urine NEGATIVE  NEGATIVE     Ketones, ur NEGATIVE  NEGATIVE (mg/dL)    Protein, ur NEGATIVE  NEGATIVE (mg/dL)    Urobilinogen, UA 0.2  0.0 - 1.0 (mg/dL)    Nitrite NEGATIVE  NEGATIVE     Leukocytes, UA NEGATIVE  NEGATIVE  MICROSCOPIC NOT DONE ON URINES WITH NEGATIVE PROTEIN, BLOOD, LEUKOCYTES, NITRITE, OR GLUCOSE <1000 mg/dL.   @[id number] @ *RADIOLOGY REPORT*  Clinical Data: Altered mental status. History of diabetes.  CHEST - 2 VIEW  Comparison: Chest radiograph performed 06/12/2006  Findings: The lungs are hypoexpanded. Vague streaky opacity is  noted at the right hilum and left lung base. This could reflect  mild pneumonia, particularly given the appearance on the lateral  view. There is no evidence of pleural effusion or pneumothorax.  There is no definite evidence for significant edema.  The heart is normal in size; the patient is status post median  sternotomy, with evidence of prior CABG. No acute osseous  abnormalities are seen.  IMPRESSION:  Vague streaky opacities at the right hilum and left lung base,  raising concern for mild pneumonia, particularly given the  appearance on the lateral view; lungs hypoexpanded.   Blood pressure 129/60, pulse 53, temperature 98.1 F (36.7 C), temperature source Oral, resp. rate 18, SpO2 100.00%.    Assessment/Plan 1. Altered metal status - most likely due to toxic metabolic encephalopathy. Patient is confused space and weak. 2. Pneumonia, right middle lobe and left base. Due to the patient's age and chronic renal failure he  is relatively immunocompromised, therefore I am not supervised he does not show [place] an elevated white count or other outward signs of pneumonia. This is most likely the cause of the patient's toxic metabolic. 3. Dehydration. 4. Acute on chronic renal failure 5. Coagulopathy due to coumadin.  INR is on the high side, so we will hold his coumadin. 6. Hypotension, responsive to IVF in ED 7. DM Type 2  FSBS and sensiitive dose SSI. 8. I will hold the patient's coumadin, sitagliptin, and HMG CoA reductase inhibitor.  The coumadin is held due to the patient's slightly elevated INR.  The sitagliptin, statin and ACE I are held due to patient's acute on chronic renal failure.  Ellard Nan 09/10/2011, 2:50 AM

## 2011-09-10 NOTE — ED Provider Notes (Signed)
History     CSN: [id number] Arrival date & time: 09/09/2011 11:42 PM   First MD Initiated Contact with Patient 09/09/11 2351      Chief Complaint  Patient presents with  . Altered Mental Status    (Consider location/radiation/quality/duration/timing/severity/associated sxs/prior treatment) Patient is a 77 y.o. male presenting with altered mental status. The history is provided by a relative.  Altered Mental Status This is a new problem. The current episode started yesterday. The problem occurs constantly. The problem has been gradually worsening. Pertinent negatives include no chest pain, no abdominal pain, no headaches and no shortness of breath. The symptoms are aggravated by nothing. The symptoms are relieved by nothing. He has tried nothing for the symptoms. The treatment provided no relief.   brought in by EMS. Patient denies any complaints. Per family, specifically daughter a home, patient is increasingly confused "talking out of his head". He is normally very independent, drives, and is able to care for himself. He has had no complaints. No fevers. Family did notice some shaking chills tonight. No reported cough or shortness of breath. No rectal bleeding. No abdominal pain or vomiting. No diarrhea. No rashes. No aggravating or alleviating symptoms. Symptoms are moderate. Symptoms have been constant and progressive since onset.  History reviewed. No pertinent past medical history.  History reviewed. No pertinent past surgical history.  History reviewed. No pertinent family history.  History  Substance Use Topics  . Smoking status: Not on file  . Smokeless tobacco: Not on file  . Alcohol Use: Not on file      Review of Systems  Constitutional: Negative for fever and chills.  HENT: Negative for neck pain and neck stiffness.   Eyes: Negative for pain.  Respiratory: Negative for shortness of breath.   Cardiovascular: Negative for chest pain.  Gastrointestinal: Negative for  abdominal pain.  Genitourinary: Negative for dysuria.  Musculoskeletal: Negative for back pain.  Skin: Negative for rash.  Neurological: Negative for dizziness, seizures, syncope, weakness, numbness and headaches.  Psychiatric/Behavioral: Positive for altered mental status.  All other systems reviewed and are negative.    Allergies  Review of patient's allergies indicates no known allergies.  Home Medications   Current Outpatient Rx  Name Route Sig Dispense Refill  . AMITRIPTYLINE HCL 10 MG PO TABS Oral Take 10 mg by mouth at bedtime. Depression      . ATORVASTATIN CALCIUM 20 MG PO TABS Oral Take 20 mg by mouth daily.      . FUROSEMIDE 40 MG PO TABS Oral Take 40 mg by mouth 2 (two) times daily.      Marland Kitchen LISINOPRIL 10 MG PO TABS Oral Take 10 mg by mouth daily.      Marland Kitchen METOPROLOL SUCCINATE 50 MG PO TB24 Oral Take 50 mg by mouth 2 (two) times daily.      Marland Kitchen POTASSIUM CHLORIDE 10 MEQ PO TBCR Oral Take 10 mEq by mouth daily.      Marland Kitchen RANITIDINE HCL 300 MG PO TABS Oral Take 300 mg by mouth daily.      Marland Kitchen SITAGLIPTIN PHOSPHATE 100 MG PO TABS Oral Take 100 mg by mouth daily.      . TRAMADOL HCL 50 MG PO TABS Oral Take 50 mg by mouth 2 (two) times daily. Maximum dose= 8 tablets per day     . WARFARIN SODIUM 3 MG PO TABS Oral Take 3 mg by mouth daily. Daughter states he takes 3mg  tab every day except Saturday and Sunday he takes  two of the 3mg  tab.       BP 129/60  Pulse 53  Temp(Src) 98.1 F (36.7 C) (Oral)  Resp 18  SpO2 100%  Physical Exam  Constitutional: He is oriented to person, place, and time. He appears well-developed and well-nourished.  HENT:  Head: Normocephalic and atraumatic.  Eyes: Conjunctivae and EOM are normal. Pupils are equal, round, and reactive to light.  Neck: Trachea normal. Neck supple. No thyromegaly present.  Cardiovascular: Normal rate, regular rhythm, S1 normal, S2 normal and normal pulses.     No systolic murmur is present   No diastolic murmur is present    Pulses:      Radial pulses are 2+ on the right side, and 2+ on the left side.  Pulmonary/Chest: Effort normal and breath sounds normal. He has no wheezes. He has no rhonchi. He has no rales. He exhibits no tenderness.  Abdominal: Soft. Normal appearance and bowel sounds are normal. There is no tenderness. There is no CVA tenderness and negative Murphy's sign.  Musculoskeletal:       BLE:s Calves nontender, no cords or erythema, negative Homans sign  Neurological: He is alert and oriented to person, place, and time. He has normal strength. No cranial nerve deficit or sensory deficit. GCS eye subscore is 4. GCS verbal subscore is 5. GCS motor subscore is 6.  Skin: Skin is warm and dry. No rash noted. He is not diaphoretic.  Psychiatric: His speech is normal.       Cooperative and appropriate    ED Course  Procedures (including critical care time)   Results for orders placed during the hospital encounter of 09/09/11  URINALYSIS, ROUTINE W REFLEX MICROSCOPIC      Component Value Range   Color, Urine YELLOW  YELLOW    Appearance CLOUDY (*) CLEAR    Specific Gravity, Urine 1.012  1.005 - 1.030    pH 5.0  5.0 - 8.0    Glucose, UA NEGATIVE  NEGATIVE (mg/dL)   Hgb urine dipstick NEGATIVE  NEGATIVE    Bilirubin Urine NEGATIVE  NEGATIVE    Ketones, ur NEGATIVE  NEGATIVE (mg/dL)   Protein, ur NEGATIVE  NEGATIVE (mg/dL)   Urobilinogen, UA 0.2  0.0 - 1.0 (mg/dL)   Nitrite NEGATIVE  NEGATIVE    Leukocytes, UA NEGATIVE  NEGATIVE   CBC      Component Value Range   WBC 7.1  4.0 - 10.5 (K/uL)   RBC 3.91 (*) 4.22 - 5.81 (MIL/uL)   Hemoglobin 12.1 (*) 13.0 - 17.0 (g/dL)   HCT 78.2 (*) 95.6 - 52.0 (%)   MCV 91.6  78.0 - 100.0 (fL)   MCH 30.9  26.0 - 34.0 (pg)   MCHC 33.8  30.0 - 36.0 (g/dL)   RDW 21.3  08.6 - 57.8 (%)   Platelets 139 (*) 150 - 400 (K/uL)  DIFFERENTIAL      Component Value Range   Neutrophils Relative 65  43 - 77 (%)   Neutro Abs 4.6  1.7 - 7.7 (K/uL)   Lymphocytes Relative  24  12 - 46 (%)   Lymphs Abs 1.7  0.7 - 4.0 (K/uL)   Monocytes Relative 7  3 - 12 (%)   Monocytes Absolute 0.5  0.1 - 1.0 (K/uL)   Eosinophils Relative 3  0 - 5 (%)   Eosinophils Absolute 0.2  0.0 - 0.7 (K/uL)   Basophils Relative 1  0 - 1 (%)   Basophils Absolute 0.0  0.0 -  0.1 (K/uL)  COMPREHENSIVE METABOLIC PANEL      Component Value Range   Sodium 136  135 - 145 (mEq/L)   Potassium 4.7  3.5 - 5.1 (mEq/L)   Chloride 101  96 - 112 (mEq/L)   CO2 25  19 - 32 (mEq/L)   Glucose, Bld 118 (*) 70 - 99 (mg/dL)   BUN 32 (*) 6 - 23 (mg/dL)   Creatinine, Ser 4.54 (*) 0.50 - 1.35 (mg/dL)   Calcium 9.5  8.4 - 09.8 (mg/dL)   Total Protein 7.2  6.0 - 8.3 (g/dL)   Albumin 3.6  3.5 - 5.2 (g/dL)   AST 13  0 - 37 (U/L)   ALT 8  0 - 53 (U/L)   Alkaline Phosphatase 81  39 - 117 (U/L)   Total Bilirubin 0.6  0.3 - 1.2 (mg/dL)   GFR calc non Af Amer 19 (*) >90 (mL/min)   GFR calc Af Amer 22 (*) >90 (mL/min)  PROTIME-INR      Component Value Range   Prothrombin Time 39.2 (*) 11.6 - 15.2 (seconds)   INR 3.95 (*) 0.00 - 1.49    Dg Chest 2 View  09/10/2011  *RADIOLOGY REPORT*  Clinical Data: Altered mental status.  History of diabetes.  CHEST - 2 VIEW  Comparison: Chest radiograph performed 06/12/2006  Findings: The lungs are hypoexpanded.  Vague streaky opacity is noted at the right hilum and left lung base.  This could reflect mild pneumonia, particularly given the appearance on the lateral view.  There is no evidence of pleural effusion or pneumothorax. There is no definite evidence for significant edema.  The heart is normal in size; the patient is status post median sternotomy, with evidence of prior CABG.  No acute osseous abnormalities are seen.  IMPRESSION: Vague streaky opacities at the right hilum and left lung base, raising concern for mild pneumonia, particularly given the appearance on the lateral view; lungs hypoexpanded.  Original Report Authenticated By: Tonia Ghent, M.D.     Date:  09/10/2011  Rate: 53  Rhythm: sinus bradycardia  QRS Axis: left  Intervals: normal  ST/T Wave abnormalities: nonspecific ST changes  Conduction Disutrbances:first-degree A-V block   Narrative Interpretation:   Old EKG Reviewed: none available    Chest x-ray labs reviewed as above. Medicine consultation requested for a mission. I discussed case as above with triad is was agrees to admit.   MDM   Altered mental status and findings on chest x-ray to suggest may require pneumonia. IV antibiotics initiated and patient does have mildly elevated INR with no findings or symptoms of active bleeding. Creatinine is elevated. IV fluids provided initial blood pressure was borderline a repeat is within normal limits. No fever or findings of sepsis otherwise.        Sunnie Nielsen, MD 09/10/11 [phone]

## 2011-09-10 NOTE — ED Notes (Signed)
Attempted to call report nurse unable to take at this time

## 2011-09-10 NOTE — Progress Notes (Signed)
Subjective: Patient seen and examined, admitted with weakness and unsteady gait, has improved since he came to the hospital.  Objective: Vital signs in last 24 hours: Temp:  [97.6 F (36.4 C)-98.5 F (36.9 C)] 98.5 F (36.9 C) (11/12 1300) Pulse Rate:  [53-64] 64  (11/12 1300) Resp:  [18-21] 20  (11/12 1300) BP: (99-144)/(60-81) 144/81 mmHg (11/12 1300) SpO2:  [95 %-100 %] 98 % (11/12 1300) Weight:  [86.229 kg (190 lb 1.6 oz)] 190 lb 1.6 oz (86.229 kg) (11/12 0410) Weight change:  Last BM Date: 09/09/11  Intake/Output from previous day:   Total I/O In: 240 [P.O.:240] Out: 2 [Urine:1; Stool:1]   Physical Exam: General: Alert, awake, oriented x3, in no acute distress. HEENT: No bruits, no goiter. Heart: Regular rate and rhythm, without murmurs, rubs, gallops. Lungs: Clear to auscultation bilaterally. Abdomen: Soft, nontender, nondistended, positive bowel sounds. Extremities: No clubbing cyanosis or edema with positive pedal pulses. Neuro: Grossly intact, nonfocal.    Lab Results: Basic Metabolic Panel:  Basename 09/10/11 0325 09/10/11 0020  NA 136 136  K 4.5 4.7  CL 101 101  CO2 25 25  GLUCOSE 118* 118*  BUN 32* 32*  CREATININE 2.95* 2.93*  CALCIUM 9.1 9.5  MG 2.2 --  PHOS -- --   Liver Function Tests:  [facility] 09/10/11 0325 09/10/11 0020  AST 11 13  ALT 8 8  ALKPHOS 75 81  BILITOT 0.5 0.6  PROT 7.1 7.2  ALBUMIN 3.6 3.6   No results found for this basename: LIPASE:2,AMYLASE:2 in the last 72 hours No results found for this basename: AMMONIA:2 in the last 72 hours CBC:  Basename 09/10/11 0325 09/10/11 0019  WBC 7.2 7.1  NEUTROABS 4.3 4.6  HGB 11.7* 12.1*  HCT 34.7* 35.8*  MCV 91.6 91.6  PLT 127* 139*   CBG:  Basename 09/10/11 1207 09/10/11 0800 09/10/11 0428  GLUCAP 175* 110* 113*   Coagulation:  Basename 09/10/11 0535 09/10/11 0100  LABPROT 40.1* 39.2*  INR 4.07* 3.95*    Recent Results (from the past 240 hour(s))  MRSA PCR SCREENING      Status: Normal   Collection Time   09/10/11  5:15 AM      Component Value Range Status Comment   MRSA by PCR NEGATIVE  NEGATIVE  Final     Studies/Results: Dg Chest 2 View  09/10/2011  *RADIOLOGY REPORT*  Clinical Data: Altered mental status.  History of diabetes.  CHEST - 2 VIEW  Comparison: Chest radiograph performed 06/12/2006  Findings: The lungs are hypoexpanded.  Vague streaky opacity is noted at the right hilum and left lung base.  This could reflect mild pneumonia, particularly given the appearance on the lateral view.  There is no evidence of pleural effusion or pneumothorax. There is no definite evidence for significant edema.  The heart is normal in size; the patient is status post median sternotomy, with evidence of prior CABG.  No acute osseous abnormalities are seen.  IMPRESSION: Vague streaky opacities at the right hilum and left lung base, raising concern for mild pneumonia, particularly given the appearance on the lateral view; lungs hypoexpanded.  Original Report Authenticated By: Tonia Ghent, M.D.    Medications: Scheduled Meds:   . azithromycin (ZITHROMAX) 500 MG IVPB  500 mg Intravenous Once  . azithromycin  500 mg Intravenous Q24H  . cefTRIAXone (ROCEPHIN) IV  1 g Intravenous Once  . cefTRIAXone (ROCEPHIN) IV  1 g Intravenous Q24H  . insulin aspart  0-9 Units Subcutaneous TID WC  .  metoprolol  50 mg Oral BID  . sodium chloride  1,000 mL Intravenous Once  . traMADol  50 mg Oral BID   Continuous Infusions:   . sodium chloride 75 mL/hr at 09/10/11 0622   PRN Meds:.  Assessment/Plan:  Principal Problem:  Altered mental status: Improved, now alert and oriented, likely due to toxic metabolic encephalopathy. Pneumonia Continue ceftriaxone and zithromax.  Dehydration, severe Continue IV fluids. Acute on chronic renal failure Cont IV fluids, will check renal fxn in am.  DM (diabetes mellitus) type II controlled with renal manifestation Continue sliding  scale insulin.  Anemia associated with chronic renal failure Will follow CBC  Warfarin-induced coagulopathy Coumadin on hold  Follow PT/INR   LOS: 1 day   Krystalynn Ridgeway S 09/10/2011, 3:41 PM

## 2011-09-10 NOTE — Progress Notes (Signed)
Antibiotic doses reviewed for renal function; Rocephin and azithro doses ordered appropriate for current renal function.  Pharmacy will sign off.  Thank you.   Vernard Gambles, PharmD, BCPS 09/10/2011 3:25 AM

## 2011-09-10 NOTE — Progress Notes (Signed)
09/10/2011 Charles Guzman [phone]       77 yo male patient admitted on 09/09/11 with c/o confusion and progressive weakness since last wednesday. Patient has a history of CAD, DM, HTN, CKD.Marland Kitchen Prior to admission, patient lived at home with daughter and ambulated with cane. In to speak with patient and daughter. Daughter voices concern regarding patient's unsteadiness and difficulty walking at home with cane. PT/OT eval not ordered as of yet. Spoke with Dr. Sharl Ma regarding need for eval orders. CM will continue to monitor.

## 2011-09-10 NOTE — ED Notes (Signed)
Admitting MD at bedside.

## 2011-09-10 NOTE — Progress Notes (Signed)
ANTICOAGULATION CONSULT NOTE - Initial Consult  Pharmacy Consult for Coumadin Indication: pulmonary embolus - Hx of in 1994  No Known Allergies  Patient Measurements: Height: 5' 7.2" (170.7 cm) Weight: 190 lb 1.6 oz (86.229 kg) IBW/kg (Calculated) : 66.56    Vital Signs: Temp: 98.5 F (36.9 C) (11/12 1300) Temp src: Oral (11/12 1300) BP: 144/81 mmHg (11/12 1300) Pulse Rate: 64  (11/12 1300)  Labs:  Basename 09/10/11 0535 09/10/11 0325 09/10/11 0100 09/10/11 0020 09/10/11 0019  HGB -- 11.7* -- -- 12.1*  HCT -- 34.7* -- -- 35.8*  PLT -- 127* -- -- 139*  APTT -- -- -- -- --  LABPROT 40.1* -- 39.2* -- --  INR 4.07* -- 3.95* -- --  HEPARINUNFRC -- -- -- -- --  CREATININE -- 2.95* -- 2.93* --  CKTOTAL -- -- -- -- --  CKMB -- -- -- -- --  TROPONINI -- -- -- -- --   Estimated Creatinine Clearance: 21 ml/min (by C-G formula based on Cr of 2.95).  Medical History: Past Medical History  Diagnosis Date  . Chronic kidney disease   . Coronary artery disease   . Hypertension   . GERD (gastroesophageal reflux disease)   . Pneumonia   . Diabetes mellitus   . Anemia      Assessment: Hx PE in 1994 - INR 4.07 today > goal 2-3, slightly low H/H, no bleeding noted.   Pna Day # 1Azith/ceftriaxone - wbc 7 afebrile  Per daughter very lethargic  Goal of Therapy:  INR 2-3   Plan:  No coumadin today Daily INR  Marcelino Scot 09/10/2011,5:08 PM

## 2011-09-10 NOTE — ED Notes (Signed)
1 gram of Rocephin given to pt at 02:19. Double chart for dextrose bag.

## 2011-09-10 NOTE — Progress Notes (Signed)
Utilization review completed. Charles Guzman 09/10/2011 

## 2011-09-11 LAB — BASIC METABOLIC PANEL
Calcium: 8.5 mg/dL (ref 8.4–10.5)
Creatinine, Ser: 2.34 mg/dL — ABNORMAL HIGH (ref 0.50–1.35)
GFR calc non Af Amer: 25 mL/min — ABNORMAL LOW (ref >90)
Glucose, Bld: 124 mg/dL — ABNORMAL HIGH (ref 70–99)
Sodium: 140 mEq/L (ref 135–145)

## 2011-09-11 LAB — CBC
Hemoglobin: 10.4 g/dL — ABNORMAL LOW (ref 13.0–17.0)
MCH: 30.4 pg (ref 26.0–34.0)
MCHC: 33.3 g/dL (ref 30.0–36.0)
MCV: 91.2 fL (ref 78.0–100.0)
Platelets: 125 10*3/uL — ABNORMAL LOW (ref 150–400)

## 2011-09-11 LAB — LEGIONELLA ANTIGEN, URINE: Legionella Antigen, Urine: NEGATIVE

## 2011-09-11 LAB — GLUCOSE, CAPILLARY
Glucose-Capillary: 280 mg/dL — ABNORMAL HIGH (ref 70–99)
Glucose-Capillary: 192 mg/dL — ABNORMAL HIGH (ref 70–99)

## 2011-09-11 MED ORDER — INSULIN ASPART 100 UNIT/ML ~~LOC~~ SOLN
0.0000 [IU] | Freq: Three times a day (TID) | SUBCUTANEOUS | Status: DC
  Administered 2011-09-11 – 2011-09-12 (×2): 3 [IU] via SUBCUTANEOUS

## 2011-09-11 MED ORDER — INSULIN ASPART 100 UNIT/ML ~~LOC~~ SOLN: 0.0000 [IU] | Freq: Every day | SUBCUTANEOUS | Status: DC

## 2011-09-11 NOTE — Progress Notes (Signed)
Physical Therapy Evaluation Patient Details Name: Charles Guzman MRN: [id number] DOB: [birth year] Today's Date: 09/11/2011  Problem List:  Patient Active Problem List  Diagnoses  . Encephalopathy, toxic  . Pneumonia  . Dehydration, severe  . Acute on chronic renal failure  . DM (diabetes mellitus) type II controlled with renal manifestation  . Hypotension, postural  . CAD (coronary artery disease)  . Anemia associated with chronic renal failure  . Warfarin-induced coagulopathy    Past Medical History:  Past Medical History  Diagnosis Date  . Chronic kidney disease   . Coronary artery disease   . Hypertension   . GERD (gastroesophageal reflux disease)   . Pneumonia   . Diabetes mellitus   . Anemia    Past Surgical History:  Past Surgical History  Procedure Date  . Coronary artery bypass graft   . Joint replacement     knees    PT Assessment/Plan/Recommendation PT Assessment Clinical Impression Statement: Pt is an 77 y/o male who present with new onset AMS who was previously independent with all ADLs/ambulation and driving. Now with decreased cognitition making patient an increased safety risk and a high risk of falling given his impulsive behavior and balance deficits/generalized weakness. Pts daughter can provide 24 hour support/assist at home and has been educated on the patients risk of falls.  PT Recommendation/Assessment: Patient will need skilled PT in the acute care venue PT Problem List: Decreased strength;Decreased activity tolerance;Decreased balance;Decreased mobility;Decreased knowledge of use of DME;Decreased cognition;Decreased safety awareness;Decreased knowledge of precautions;Pain PT Therapy Diagnosis : Difficulty walking;Abnormality of gait;Generalized weakness;Acute pain;Altered mental status PT Plan PT Frequency: Min 3X/week PT Treatment/Interventions: Stair training;Gait training;DME instruction;Therapeutic exercise;Functional mobility  training;Balance training;Cognitive remediation;Neuromuscular re-education;Patient/family education PT Recommendation Recommendations for Other Services: OT consult Follow Up Recommendations: Home health PT;24 hour supervision/assistance Equipment Recommended: 3 in 1 bedside comode;Tub/shower seat PT Goals  Acute Rehab PT Goals PT Goal Formulation: With patient Time For Goal Achievement: 2 weeks Pt will go Supine/Side to Sit: with modified independence;with HOB 0 degrees PT Goal: Supine/Side to Sit - Progress: Progressing toward goal Pt will go Sit to Supine/Side: with modified independence PT Goal: Sit to Supine/Side - Progress: Progressing toward goal Pt will Transfer Sit to Stand/Stand to Sit: with modified independence PT Transfer Goal: Sit to Stand/Stand to Sit - Progress: Progressing toward goal Pt will Transfer Bed to Chair/Chair to Bed: with modified independence PT Transfer Goal: Bed to Chair/Chair to Bed - Progress: Progressing toward goal Pt will Ambulate: 51 - 150 feet;with rolling walker;with modified independence (while able to demonstrate safe technique with RW independent) PT Goal: Ambulate - Progress: Progressing toward goal Pt will Go Up / Down Stairs: 3-5 stairs;with supervision;with rail(s) PT Goal: Up/Down Stairs - Progress: Progressing toward goal Pt will Perform Home Exercise Program: Independently PT Goal: Perform Home Exercise Program - Progress: Progressing toward goal Additional Goals Additional Goal #1: Pt will demonstrate decreased risk of falls with Berg balance score >/= 46/56 PT Goal: Additional Goal #1 - Progress: Progressing toward goal  PT Evaluation Precautions/Restrictions  Precautions Precautions: Fall Restrictions Weight Bearing Restrictions: No Prior Functioning  Home Living Lives With: Daughter (24 hours with pt's daughter and granddaughter) Type of Home: Mobile home Home Layout: One level Home Access: Ramped entrance;Stairs to  enter Entrance Stairs-Rails: Can reach both;Right;Left Entrance Stairs-Number of Steps: 5-6 Bathroom Shower/Tub: Walk-in shower (small step into shower) Bathroom Toilet: Standard Home Adaptive Equipment: Straight cane;Walker - rolling Additional Comments: could be a quad cane?? daughter not sure  Prior Function Level of Independence: Independent with basic ADLs;Independent with homemaking with ambulation Driving: Yes (daughter very concerned about this) Vocation: Unemployed Comments: Daughter reports some increased difficulty getting out of shower Cognition Cognition Arousal/Alertness: Awake/alert Overall Cognitive Status: Impaired Attention: Impaired Current Attention Level: Selective Memory: Appears impaired Memory Deficits: ST-memory affected, doesn't know why he is here or when he came in; remembers the year but unsure of day of week/date Orientation Level: Oriented to person;Disoriented to place;Disoriented to time;Disoriented to situation Safety/Judgement: Decreased awareness of safety precautions Decreased Safety/Judgement: Decreased awareness of need for assistance;Impulsive Awareness of Errors: Decreased awareness of errors made Decreased Awareness of Errors: Assistance required to identify errors made Awareness of Deficits: Decreased awareness of deficits Awareness of Deficits - Other Comments: Pt does not seem to understand his cognitive deficits, very impulsive, jumping up, needs cueing for safety Problem Solving: Requires assistance for problem solving Cognition - Other Comments: requires assist for safety with RW  Sensation/Coordination Sensation Light Touch: Appears Intact Coordination Gross Motor Movements are Fluid and Coordinated: Yes Fine Motor Movements are Fluid and Coordinated: Yes Extremity Assessment RUE Assessment RUE Assessment: Within Functional Limits LUE Assessment LUE Assessment: Within Functional Limits RLE Assessment RLE Assessment:  (grossly >/=  4/5) LLE Assessment LLE Assessment:  (grossly >/= 4/5) Mobility (including Balance) Bed Mobility Bed Mobility: Yes Supine to Sit: 5: Supervision;HOB elevated (Comment degrees) Supine to Sit Details (indicate cue type and reason): pt very impulsive, use of HOB elevated, cues for safety Sitting - Scoot to Edge of Bed: 5: Supervision Sitting - Scoot to [place] of Bed Details (indicate cue type and reason): pt sitting EOB with L hip scooted more anterior than right and lean to left, pt needing cueing to fix but able to do so without physical assist Transfers Transfers: Yes Sit to Stand: 1: +2 Total assist;Without upper extremity assist;With upper extremity assist;From elevated surface Sit to Stand Details (indicate cue type and reason): +2totalpt 70% with increased effort for follow through/trunk/hip/knee extension to stand upright; pt utilizes L grip of RW to pull on and pushes with RUE from bed to initiate Stand to Sit Details: pt sat VERY IMPULSIVELY with uncontrol plop into recliner, did not reach back nor try to control descent Ambulation/Gait Ambulation/Gait: Yes Ambulation/Gait Assistance: 4: Min assist Ambulation/Gait Assistance Details (indicate cue type and reason): pt amb. approx 35 ft with poor technique, very flexed trunk pushing RW almost a foot and a half out in front of his feet; corrects with manual facilitation but poor follow through/retention; limited by back pain Ambulation Distance (Feet): 35 Feet Assistive device: Rolling walker Gait Pattern: Trunk flexed;Shuffle  Posture/Postural Control Posture/Postural Control: Postural limitations Postural Limitations: pt with significantly flexed trunk Balance Balance Assessed: Yes Dynamic Standing Balance Dynamic Standing - Balance Support: Left upper extremity supported Dynamic Standing - Level of Assistance: 4: Min assist (pt able to squat and perform hygiene with RUE) Exercise    End of Session PT - End of Session Equipment  Utilized During Treatment: Gait belt Activity Tolerance: Patient limited by pain (pt limited by cognitive status/impulsivity) Patient left: in chair;with family/visitor present;with call bell in reach Nurse Communication: Mobility status for transfers;Mobility status for ambulation General Behavior During Session: [facility] for tasks performed Cognition: Impaired Cognitive Impairment: Pt very impulsive with decreased orientation, awareness of safety/judgement, awareness of balance deficit; awareness of need for help. Pt also unaware that he had soiled his bed with urine.   [facility] HELEN 09/11/2011, 10:39 AM

## 2011-09-11 NOTE — Consult Note (Signed)
Tobacco Cessation- Unable to assess pt due to AMS.

## 2011-09-11 NOTE — Progress Notes (Signed)
09/11/2011 Charles Guzman Case Management Note [phone]      HOME HEALTH AGENCIES SERVING [place]   Agencies that are Medicare-Certified and are affiliated with [facility]  Telephone Number Address  [facility]   [facility] has ownership interest in this company; however, you are under no obligation to use this agency. [phone] or  [phone] [address] [place], [place] [address]   Agencies that are Medicare-Certified and are not affiliated with [facility] Telephone Number Address  [facility] [phone] Fax [phone] [address] [place], [place]  [address]  [facility] [phone] or [phone] Fax [phone] [address] [place], [place] [address]  [facility] [phone] Fax [phone] [address] [place], [place] [address]  [facility] [phone] Fax [phone] [address] [place], [place]  [address]  [facility] [phone] Fax [phone] [address] [place], [place] [address]  [facility] [phone] [address] [place], [place] [address]  [facility] [phone]  [address] [place], [place] [address]  [facility] [phone] or [phone] Fax [phone] [address] [place], [place]  [address]  [facility] [phone] Fax [phone] [address] [place], [place]  [address]  [facility]  [phone] Fax [phone] [address] [place] [address] [place], [place] [address]               Agencies that are not Medicare-Certified and are not affiliated with [facility] Agency Telephone Number Address  [facility] [place] [phone] or [phone] Fax [phone] [address] [place], [place]  [address]  [facility] [phone] Fax [phone] [address] [place], [place]  [address]  [facility]  [phone] Fax [phone] [address] [place], [place] [address]  [facility] [phone] Fax [phone] [address] [place], [place] [address]  [facility] [phone] or [phone] Fax [phone] [address] [place], [place]  [address]  [facility] [phone] or [phone] Fax [phone] [address], [address] [place], [place]  [address]  [facility] [phone] Fax [phone] [address] [place], [place]  [address]  [facility] [phone] [address] [place], [place]  [address]  [facility] [phone] Fax [phone] [address] [place], [place]  [address]  [facility] [phone] Fax [phone] [address] [place], [place]  [address]  [facility] [phone] Fax [phone] [address] [place], [place] [address]  [facility] [phone] Fax [phone] [address] [place], [place]  [address]     Choice of home health  agencies given to daughter and patient.

## 2011-09-11 NOTE — Progress Notes (Signed)
Subjective: Patient seen and examined, admitted with weakness and unsteady gait, has improved since he came to the hospital.  Objective: Vital signs in last 24 hours: Temp:  [98.3 F (36.8 C)-98.6 F (37 C)] 98.3 F (36.8 C) (11/13 0500) Pulse Rate:  [55-72] 72  (11/13 0946) Resp:  [18-19] 19  (11/13 0500) BP: (128-146)/(64-74) 138/74 mmHg (11/13 0946) SpO2:  [93 %-95 %] 95 % (11/13 0500) Weight change:  Last BM Date: 09/11/11  Intake/Output from previous day: 11/12 0701 - 11/13 0700 In: 240 [P.O.:240] Out: 3 [Urine:2; Stool:1] Total I/O In: 240 [P.O.:240] Out: 3 [Urine:2; Stool:1]   Physical Exam: General: Alert, awake, oriented x3, in no acute distress. HEENT: No bruits, no goiter. Heart: Regular rate and rhythm, without murmurs, rubs, gallops. Lungs: Clear to auscultation bilaterally. Abdomen: Soft, nontender, nondistended, positive bowel sounds. Extremities: No clubbing cyanosis or edema with positive pedal pulses. Neuro: Grossly intact, nonfocal.    Lab Results: Basic Metabolic Panel:  Basename 09/11/11 0510 09/10/11 0325  NA 140 136  K 4.4 4.5  CL 107 101  CO2 21 25  GLUCOSE 124* 118*  BUN 27* 32*  CREATININE 2.34* 2.95*  CALCIUM 8.5 9.1  MG -- 2.2  PHOS -- --   Liver Function Tests:  [facility] 09/10/11 0325 09/10/11 0020  AST 11 13  ALT 8 8  ALKPHOS 75 81  BILITOT 0.5 0.6  PROT 7.1 7.2  ALBUMIN 3.6 3.6   No results found for this basename: LIPASE:2,AMYLASE:2 in the last 72 hours No results found for this basename: AMMONIA:2 in the last 72 hours CBC:  Basename 09/11/11 0510 09/10/11 0325 09/10/11 0019  WBC 4.9 7.2 --  NEUTROABS -- 4.3 4.6  HGB 10.4* 11.7* --  HCT 31.2* 34.7* --  MCV 91.2 91.6 --  PLT 125* 127* --   CBG:  Basename 09/11/11 1212 09/11/11 0730 09/10/11 2123 09/10/11 1701 09/10/11 1207 09/10/11 0800  GLUCAP 280* 111* 151* 153* 175* 110*   Coagulation:  Basename 09/11/11 0510 09/10/11 0535  LABPROT 40.6* 40.1*  INR  4.13* 4.07*    Recent Results (from the past 240 hour(s))  CULTURE, BLOOD (ROUTINE X 2)     Status: Normal (Preliminary result)   Collection Time   09/10/11  3:05 AM      Component Value Range Status Comment   Specimen Description BLOOD LEFT ARM   Final    Special Requests BOTTLES DRAWN AEROBIC AND ANAEROBIC 10CC EA   Final    Setup Time [id number]   Final    Culture     Final    Value:        BLOOD CULTURE RECEIVED NO GROWTH TO DATE CULTURE WILL BE HELD FOR 5 DAYS BEFORE ISSUING A FINAL NEGATIVE REPORT   Report Status PENDING   Incomplete   CULTURE, BLOOD (ROUTINE X 2)     Status: Normal (Preliminary result)   Collection Time   09/10/11  3:10 AM      Component Value Range Status Comment   Specimen Description BLOOD LEFT ARM   Final    Special Requests BOTTLES DRAWN AEROBIC AND ANAEROBIC 10CC EA   Final    Setup Time [id number]   Final    Culture     Final    Value:        BLOOD CULTURE RECEIVED NO GROWTH TO DATE CULTURE WILL BE HELD FOR 5 DAYS BEFORE ISSUING A FINAL NEGATIVE REPORT   Report Status PENDING   Incomplete  MRSA PCR SCREENING     Status: Normal   Collection Time   09/10/11  5:15 AM      Component Value Range Status Comment   MRSA by PCR NEGATIVE  NEGATIVE  Final   CULTURE, BLOOD (ROUTINE X 2)     Status: Normal (Preliminary result)   Collection Time   09/10/11  5:25 AM      Component Value Range Status Comment   Specimen Description BLOOD LEFT ARM   Final    Special Requests BOTTLES DRAWN AEROBIC ONLY 10CC   Final    Setup Time [id number]   Final    Culture     Final    Value:        BLOOD CULTURE RECEIVED NO GROWTH TO DATE CULTURE WILL BE HELD FOR 5 DAYS BEFORE ISSUING A FINAL NEGATIVE REPORT   Report Status PENDING   Incomplete   CULTURE, BLOOD (ROUTINE X 2)     Status: Normal (Preliminary result)   Collection Time   09/10/11  5:35 AM      Component Value Range Status Comment   Specimen Description BLOOD LEFT HAND   Final    Special Requests BOTTLES  DRAWN AEROBIC ONLY 8CC   Final    Setup Time [id number]   Final    Culture     Final    Value:        BLOOD CULTURE RECEIVED NO GROWTH TO DATE CULTURE WILL BE HELD FOR 5 DAYS BEFORE ISSUING A FINAL NEGATIVE REPORT   Report Status PENDING   Incomplete     Studies/Results: Dg Chest 2 View  09/10/2011  *RADIOLOGY REPORT*  Clinical Data: Altered mental status.  History of diabetes.  CHEST - 2 VIEW  Comparison: Chest radiograph performed 06/12/2006  Findings: The lungs are hypoexpanded.  Vague streaky opacity is noted at the right hilum and left lung base.  This could reflect mild pneumonia, particularly given the appearance on the lateral view.  There is no evidence of pleural effusion or pneumothorax. There is no definite evidence for significant edema.  The heart is normal in size; the patient is status post median sternotomy, with evidence of prior CABG.  No acute osseous abnormalities are seen.  IMPRESSION: Vague streaky opacities at the right hilum and left lung base, raising concern for mild pneumonia, particularly given the appearance on the lateral view; lungs hypoexpanded.  Original Report Authenticated By: Tonia Ghent, M.D.   Ct Head Wo Contrast  09/10/2011  *RADIOLOGY REPORT*  Clinical Data: Altered mental status.  CT HEAD WITHOUT CONTRAST  Technique:  Contiguous axial images were obtained from the base of the skull through the vertex without contrast.  Comparison: 04/01/2006  Findings: There is no evidence for acute infarction, intracranial hemorrhage, mass lesion, hydrocephalus, or extra-axial fluid. Moderate atrophy is present. There is chronic microvascular ischemic change  present in the periventricular and subcortical white matter.  Calvarium intact.  Moderate vascular calcification.  Clear sinuses and mastoids.  Little change from priors.  IMPRESSION: Atrophy and small vessel disease.  No acute intracranial findings.  Original Report Authenticated By: Elsie Stain, M.D.     Medications: Scheduled Meds:    . azithromycin (ZITHROMAX) 500 MG IVPB  500 mg Intravenous Once  . azithromycin  500 mg Intravenous Q24H  . cefTRIAXone (ROCEPHIN) IV  1 g Intravenous Q24H  . insulin aspart  0-9 Units Subcutaneous TID WC  . metoprolol  50 mg Oral BID  . traMADol  50 mg  Oral BID  . DISCONTD: metoprolol  50 mg Oral BID   Continuous Infusions:    . sodium chloride 75 mL/hr at 09/10/11 0622   PRN Meds:.  Assessment/Plan:  Principal Problem:  Altered mental status: Improved, now alert and oriented, likely due to toxic metabolic encephalopathy. Pneumonia Improving. Continue ceftriaxone and zithromax.  Dehydration, severe Continue IV fluids. Acute on chronic renal failure Renal function is slowly improving. Cont iv fluids.  DM (diabetes mellitus) type II controlled with renal manifestation Blood glucose elevated today. Continue sliding scale insulin.  Anemia associated with chronic renal failure Will follow CBC  Warfarin-induced coagulopathy Coumadin on hold  Follow PT/INR Pharmacy managing coumadin.   LOS: 2 days   Johsua Shevlin S 09/11/2011, 2:09 PM

## 2011-09-11 NOTE — Progress Notes (Signed)
ANTICOAGULATION CONSULT NOTE - Follow Up Consult  Pharmacy Consult for Coumadin Indication: History of PE  No Known Allergies  Patient Measurements: Height: 5' 7.2" (170.7 cm) Weight: 190 lb 1.6 oz (86.229 kg) IBW/kg (Calculated) : 66.56    Vital Signs: Temp: 98.3 F (36.8 C) (11/13 0500) Temp src: Oral (11/13 0500) BP: 138/74 mmHg (11/13 0946) Pulse Rate: 72  (11/13 0946)  Labs:  Basename 09/11/11 0510 09/10/11 0535 09/10/11 0325 09/10/11 0100 09/10/11 0020 09/10/11 0019  HGB 10.4* -- 11.7* -- -- --  HCT 31.2* -- 34.7* -- -- 35.8*  PLT 125* -- 127* -- -- 139*  APTT -- -- -- -- -- --  LABPROT 40.6* 40.1* -- 39.2* -- --  INR 4.13* 4.07* -- 3.95* -- --  HEPARINUNFRC -- -- -- -- -- --  CREATININE 2.34* -- 2.95* -- 2.93* --  CKTOTAL -- -- -- -- -- --  CKMB -- -- -- -- -- --  TROPONINI -- -- -- -- -- --   Estimated Creatinine Clearance: 26.5 ml/min (by C-G formula based on Cr of 2.34).   Medications:  Scheduled:    . azithromycin (ZITHROMAX) 500 MG IVPB  500 mg Intravenous Once  . azithromycin  500 mg Intravenous Q24H  . cefTRIAXone (ROCEPHIN) IV  1 g Intravenous Q24H  . insulin aspart  0-9 Units Subcutaneous TID WC  . metoprolol  50 mg Oral BID  . traMADol  50 mg Oral BID  . DISCONTD: metoprolol  50 mg Oral BID    Assessment: 77 yo male with CAP & toxic metabolic encephalopathy.  INR up again slightly & Hg down to 10.4.  No bleeding issues noted.  Goal of Therapy:  INR 2-3   Plan:  1.  No Coumadin today 2.  F/u AM   Palin Tristan P 09/11/2011,12:36 PM

## 2011-09-12 LAB — GLUCOSE, CAPILLARY
Glucose-Capillary: 110 mg/dL — ABNORMAL HIGH (ref 70–99)
Glucose-Capillary: 186 mg/dL — ABNORMAL HIGH (ref 70–99)

## 2011-09-12 LAB — PROTIME-INR: Prothrombin Time: 36.3 seconds — ABNORMAL HIGH (ref 11.6–15.2)

## 2011-09-12 MED ORDER — AZITHROMYCIN 250 MG PO TABS: 250.0000 mg | ORAL_TABLET | Freq: Every day | ORAL | Status: AC

## 2011-09-12 MED ORDER — WARFARIN SODIUM 3 MG PO TABS: 3.0000 mg | ORAL_TABLET | Freq: Every day | ORAL | Status: DC

## 2011-09-12 MED ORDER — CEFUROXIME AXETIL 500 MG PO TABS: 500.0000 mg | ORAL_TABLET | Freq: Two times a day (BID) | ORAL | Status: AC

## 2011-09-12 MED ORDER — FUROSEMIDE 40 MG PO TABS: 40.0000 mg | ORAL_TABLET | Freq: Every day | ORAL | Status: DC

## 2011-09-12 NOTE — Progress Notes (Signed)
Subjective: No complaints patient is feeling better.  Wants to know when he can be discharged.  Objective: Vital signs in last 24 hours: Filed Vitals:   09/11/11 0946 09/11/11 1340 09/11/11 2120 09/12/11 0519  BP: 138/74 109/69 133/68 127/63  Pulse: 72 72 58 60  Temp:  98.5 F (36.9 C) 97.7 F (36.5 C) 98.1 F (36.7 C)  TempSrc:  Oral Oral Oral  Resp:  18 20 20   Height:      Weight:      SpO2:  98% 96% 97%   Weight change:   Intake/Output Summary (Last 24 hours) at 09/12/11 1224 Last data filed at 09/12/11 0932  Gross per 24 hour  Intake    720 ml  Output      1 ml  Net    719 ml   Physical Exam: General: Awake, Oriented, No acute distress. HEENT: EOMI. Neck: Supple CV: S1 and S2 Lungs: Clear to ascultation bilaterally Abdomen: Soft, Nontender, Nondistended, +bowel sounds. Ext: Good pulses. Trace edema.  Lab Results:  [facility] 09/11/11 0510 09/10/11 0325  NA 140 136  K 4.4 4.5  CL 107 101  CO2 21 25  GLUCOSE 124* 118*  BUN 27* 32*  CREATININE 2.34* 2.95*  CALCIUM 8.5 9.1  MG -- 2.2  PHOS -- --    Basename 09/10/11 0325 09/10/11 0020  AST 11 13  ALT 8 8  ALKPHOS 75 81  BILITOT 0.5 0.6  PROT 7.1 7.2  ALBUMIN 3.6 3.6   No results found for this basename: LIPASE:2,AMYLASE:2 in the last 72 hours  Basename 09/11/11 0510 09/10/11 0325 09/10/11 0019  WBC 4.9 7.2 --  NEUTROABS -- 4.3 4.6  HGB 10.4* 11.7* --  HCT 31.2* 34.7* --  MCV 91.2 91.6 --  PLT 125* 127* --   No results found for this basename: CKTOTAL:3,CKMB:3,CKMBINDEX:3,TROPONINI:3 in the last 72 hours No results found for this basename: POCBNP:3 in the last 72 hours No results found for this basename: DDIMER:2 in the last 72 hours No results found for this basename: HGBA1C:2 in the last 72 hours No results found for this basename: CHOL:2,HDL:2,LDLCALC:2,TRIG:2,CHOLHDL:2,LDLDIRECT:2 in the last 72 hours No results found for this basename: TSH,T4TOTAL,FREET3,T3FREE,THYROIDAB in the last 72  hours No results found for this basename: VITAMINB12:2,FOLATE:2,FERRITIN:2,TIBC:2,IRON:2,RETICCTPCT:2 in the last 72 hours  Micro Results: Recent Results (from the past 240 hour(s))  CULTURE, BLOOD (ROUTINE X 2)     Status: Normal (Preliminary result)   Collection Time   09/10/11  3:05 AM      Component Value Range Status Comment   Specimen Description BLOOD LEFT ARM   Final    Special Requests BOTTLES DRAWN AEROBIC AND ANAEROBIC 10CC EA   Final    Setup Time [id number]   Final    Culture     Final    Value:        BLOOD CULTURE RECEIVED NO GROWTH TO DATE CULTURE WILL BE HELD FOR 5 DAYS BEFORE ISSUING A FINAL NEGATIVE REPORT   Report Status PENDING   Incomplete   CULTURE, BLOOD (ROUTINE X 2)     Status: Normal (Preliminary result)   Collection Time   09/10/11  3:10 AM      Component Value Range Status Comment   Specimen Description BLOOD LEFT ARM   Final    Special Requests BOTTLES DRAWN AEROBIC AND ANAEROBIC 10CC EA   Final    Setup Time [id number]   Final    Culture     Final  Value:        BLOOD CULTURE RECEIVED NO GROWTH TO DATE CULTURE WILL BE HELD FOR 5 DAYS BEFORE ISSUING A FINAL NEGATIVE REPORT   Report Status PENDING   Incomplete   MRSA PCR SCREENING     Status: Normal   Collection Time   09/10/11  5:15 AM      Component Value Range Status Comment   MRSA by PCR NEGATIVE  NEGATIVE  Final   CULTURE, BLOOD (ROUTINE X 2)     Status: Normal (Preliminary result)   Collection Time   09/10/11  5:25 AM      Component Value Range Status Comment   Specimen Description BLOOD LEFT ARM   Final    Special Requests BOTTLES DRAWN AEROBIC ONLY 10CC   Final    Setup Time [id number]   Final    Culture     Final    Value:        BLOOD CULTURE RECEIVED NO GROWTH TO DATE CULTURE WILL BE HELD FOR 5 DAYS BEFORE ISSUING A FINAL NEGATIVE REPORT   Report Status PENDING   Incomplete   CULTURE, BLOOD (ROUTINE X 2)     Status: Normal (Preliminary result)   Collection Time   09/10/11  5:35  AM      Component Value Range Status Comment   Specimen Description BLOOD LEFT HAND   Final    Special Requests BOTTLES DRAWN AEROBIC ONLY 8CC   Final    Setup Time [id number]   Final    Culture     Final    Value:        BLOOD CULTURE RECEIVED NO GROWTH TO DATE CULTURE WILL BE HELD FOR 5 DAYS BEFORE ISSUING A FINAL NEGATIVE REPORT   Report Status PENDING   Incomplete     Studies/Results: Dg Chest 2 View  09/10/2011  *RADIOLOGY REPORT*  Clinical Data: Altered mental status.  History of diabetes.  CHEST - 2 VIEW  Comparison: Chest radiograph performed 06/12/2006  Findings: The lungs are hypoexpanded.  Vague streaky opacity is noted at the right hilum and left lung base.  This could reflect mild pneumonia, particularly given the appearance on the lateral view.  There is no evidence of pleural effusion or pneumothorax. There is no definite evidence for significant edema.  The heart is normal in size; the patient is status post median sternotomy, with evidence of prior CABG.  No acute osseous abnormalities are seen.  IMPRESSION: Vague streaky opacities at the right hilum and left lung base, raising concern for mild pneumonia, particularly given the appearance on the lateral view; lungs hypoexpanded.  Original Report Authenticated By: Tonia Ghent, M.D.   Ct Head Wo Contrast  09/10/2011  *RADIOLOGY REPORT*  Clinical Data: Altered mental status.  CT HEAD WITHOUT CONTRAST  Technique:  Contiguous axial images were obtained from the base of the skull through the vertex without contrast.  Comparison: 04/01/2006  Findings: There is no evidence for acute infarction, intracranial hemorrhage, mass lesion, hydrocephalus, or extra-axial fluid. Moderate atrophy is present. There is chronic microvascular ischemic change  present in the periventricular and subcortical white matter.  Calvarium intact.  Moderate vascular calcification.  Clear sinuses and mastoids.  Little change from priors.  IMPRESSION: Atrophy and  small vessel disease.  No acute intracranial findings.  Original Report Authenticated By: Elsie Stain, M.D.    Medications: I have reviewed the patient's current medications. Scheduled Meds:   . azithromycin (ZITHROMAX) 500 MG IVPB  500 mg  Intravenous Once  . azithromycin  500 mg Intravenous Q24H  . cefTRIAXone (ROCEPHIN) IV  1 g Intravenous Q24H  . insulin aspart  0-15 Units Subcutaneous TID WC  . insulin aspart  0-5 Units Subcutaneous QHS  . metoprolol  50 mg Oral BID  . traMADol  50 mg Oral BID  . DISCONTD: insulin aspart  0-9 Units Subcutaneous TID WC   Continuous Infusions:   . sodium chloride 75 mL/hr at 09/12/11 0032   PRN Meds:.  Assessment/Plan: Principal Problem:  *CAD (coronary artery disease) Active Problems:  Encephalopathy, toxic  Pneumonia  Dehydration, severe  Acute on chronic renal failure  DM (diabetes mellitus) type II controlled with renal manifestation  Hypotension, postural  Anemia associated with chronic renal failure  Warfarin-induced coagulopathy  1. Community acquired pneumonia.  Continue ceftriaxone and azithromycin.  Antibiotics started since 09/10/2011. 2. Acute delirium/toxic encephalopathy.  Likely due to #1. 3. Dehydration resolved. 4. Chronic kidney disease stage IV.  Stable at this time continue to follow up with Ralene Ok, MD, MD as outpatient. 5. Type II DM.  Stable. 6. Anemia likely due to chronic kidney disease.  Hemoglobin stable. 7.  Disposition.  Discharge home with home health PT and OT and RN.    LOS: 3 days  Harmony Sandell A, MD 09/12/2011, 12:24 PM

## 2011-09-12 NOTE — Progress Notes (Signed)
ANTICOAGULATION CONSULT NOTE - Follow Up Consult  Pharmacy Consult for Coumadin Indication: h/o PE  No Known Allergies  Patient Measurements: Height: 5' 7.2" (170.7 cm) Weight: 190 lb 1.6 oz (86.229 kg) IBW/kg (Calculated) : 66.56    Vital Signs: Temp: 98.1 F (36.7 C) (11/14 0519) Temp src: Oral (11/14 0519) BP: 127/63 mmHg (11/14 0519) Pulse Rate: 60  (11/14 0519)  Labs:  Basename 09/12/11 0505 09/11/11 0510 09/10/11 0535 09/10/11 0325 09/10/11 0020 09/10/11 0019  HGB -- 10.4* -- 11.7* -- --  HCT -- 31.2* -- 34.7* -- 35.8*  PLT -- 125* -- 127* -- 139*  APTT -- -- -- -- -- --  LABPROT 36.3* 40.6* 40.1* -- -- --  INR 3.58* 4.13* 4.07* -- -- --  HEPARINUNFRC -- -- -- -- -- --  CREATININE -- 2.34* -- 2.95* 2.93* --  CKTOTAL -- -- -- -- -- --  CKMB -- -- -- -- -- --  TROPONINI -- -- -- -- -- --   Estimated Creatinine Clearance: 26.5 ml/min (by C-G formula based on Cr of 2.34).   Medications:  Scheduled:    . azithromycin (ZITHROMAX) 500 MG IVPB  500 mg Intravenous Once  . azithromycin  500 mg Intravenous Q24H  . cefTRIAXone (ROCEPHIN) IV  1 g Intravenous Q24H  . insulin aspart  0-15 Units Subcutaneous TID WC  . insulin aspart  0-5 Units Subcutaneous QHS  . metoprolol  50 mg Oral BID  . traMADol  50 mg Oral BID  . DISCONTD: insulin aspart  0-9 Units Subcutaneous TID WC   Goal of Therapy:  INR 2-3   Assessment and Plan:  77yo male with history of PE, admitted with supratherapeutic INR on home dose of 3mg  daily, except 6mg  on Sat/Sun.  INR falling nicely- 3.58 this AM.  No problems noted & pt is being discharged home on 3mg  daily.  Agree with dosage change & instructions to hold Coumadin until INR checked on 11/15.  Renaee Munda 09/12/2011,1:02 PM

## 2011-09-12 NOTE — Progress Notes (Signed)
Spoke with patient and his daughter about HHC. Gave them [facility] list of [facility] agencies, they chose [facility] for HHPT, OT and RN. Patient already has a 3 in 1 and does not want tub bench due to having shower instead of tub. Explained to patient and family how 3 in 1 can function as a shower chair. Contacted Marie at [facility] and requested [facility], PT and OT. Requested RN to draw PT/INR on 09/13/11 and call results to PCP. Request entered in [facility]. Jacquelynn Cree RN, BSN, CCM

## 2011-09-12 NOTE — Discharge Summary (Signed)
Discharge Summary  Charles Guzman MR#: [id number]  DOB:[birth year]  Date of Admission: 09/09/2011 Date of Discharge: 09/12/2011  Patient's PCP: Ralene Ok, MD, MD  Attending Physician:Renia Mikelson A  Consults: None.  Discharge Diagnoses: Active Problems:  Encephalopathy, toxic  Pneumonia  Dehydration, severe  Acute on chronic renal failure  DM (diabetes mellitus) type II controlled with renal manifestation  Hypotension, postural  Anemia associated with chronic renal failure  Warfarin-induced coagulopathy  Brief Admitting History and Physical [name] is a 77 year old male who was brought in on 09/10/2011 with confusion and lethargy.  He was eventually admitted for pneumonia.  Discharge Medications Current Discharge Medication List    START taking these medications   Details  azithromycin (ZITHROMAX) 250 MG tablet Take 1 tablet (250 mg total) by mouth daily. Qty: 3 tablet, Refills: 0    cefUROXime (CEFTIN) 500 MG tablet Take 1 tablet (500 mg total) by mouth 2 (two) times daily. Qty: 14 tablet, Refills: 0      CONTINUE these medications which have CHANGED   Details  furosemide (LASIX) 40 MG tablet Take 1 tablet (40 mg total) by mouth daily. Qty: 30 tablet, Refills: 0    warfarin (COUMADIN) 3 MG tablet Take 1 tablet (3 mg total) by mouth daily. Daughter states he takes 3mg  tab every day except Saturday and Sunday he takes two of the 3mg  tab.  Please hold Coumadin until 09/13/2011, until PT/INR is drawn.      CONTINUE these medications which have NOT CHANGED   Details  amitriptyline (ELAVIL) 10 MG tablet Take 10 mg by mouth at bedtime. Depression      atorvastatin (LIPITOR) 20 MG tablet Take 20 mg by mouth daily.      lisinopril (PRINIVIL,ZESTRIL) 10 MG tablet Take 10 mg by mouth daily.      metoprolol (TOPROL-XL) 50 MG 24 hr tablet Take 50 mg by mouth 2 (two) times daily.      potassium chloride (KLOR-CON) 10 MEQ CR tablet Take 10 mEq by mouth daily.        ranitidine (ZANTAC) 300 MG tablet Take 300 mg by mouth daily.      sitaGLIPtin (JANUVIA) 100 MG tablet Take 100 mg by mouth daily.      traMADol (ULTRAM) 50 MG tablet Take 50 mg by mouth 2 (two) times daily. Maximum dose= 8 tablets per day     insulin detemir (LEVEMIR) 100 UNIT/ML injection Inject 40 Units into the skin at bedtime.      insulin lispro (HUMALOG) 100 UNIT/ML injection Inject into the skin 3 (three) times daily before meals. Sliding scale         Hospital Course: 1. Community acquired pneumonia.  Continue ceftriaxone and azithromycin.  Antibiotics started since 09/10/2011. 2. Acute delirium/toxic encephalopathy.  Likely due to #1.  At the time of discharge patient was oriented to self, location, and time.  Per family was back to baseline. 3. Dehydration resolved.  Resolved with IV hydration. 4. Acute on Chronic kidney disease stage IV.  No baseline creatinine available since February of 2011.  Thought to be due to dehydration.  Initially furosemide was held.  However at discharge to only half the dose of furosemide will be resumed.  Will have the patient followup with Ralene Ok, MD, MD for management of his chronic kidney disease.  5. Type II DM.  Stable.  Was on sliding scale insulin at discharge was resumed back on home medication. 6. Anemia likely due to chronic kidney disease.  Hemoglobin stable. 7.  Disposition.  Discharge home with home health PT and OT and RN, after being evaluated by PT and OT.  Day of Discharge BP 127/63  Pulse 60  Temp(Src) 98.1 F (36.7 C) (Oral)  Resp 20  Ht 5' 7.2" (1.707 m)  Wt 86.229 kg (190 lb 1.6 oz)  BMI 29.60 kg/m2  SpO2 97%  Results for orders placed during the hospital encounter of 09/09/11 (from the past 48 hour(s))  GLUCOSE, CAPILLARY     Status: Abnormal   Collection Time   09/10/11  5:01 PM      Component Value Range Comment   Glucose-Capillary 153 (*) 70 - 99 (mg/dL)    Comment 1 Notify RN     GLUCOSE, CAPILLARY      Status: Abnormal   Collection Time   09/10/11  9:23 PM      Component Value Range Comment   Glucose-Capillary 151 (*) 70 - 99 (mg/dL)   LEGIONELLA ANTIGEN, URINE     Status: Normal   Collection Time   09/10/11 10:07 PM      Component Value Range Comment   Specimen Description URINE, CLEAN CATCH      Special Requests NONE      Legionella Antigen, Urine Negative for Legionella pneumophilia serogroup 1      Report Status 09/11/2011 FINAL     CBC     Status: Abnormal   Collection Time   09/11/11  5:10 AM      Component Value Range Comment   WBC 4.9  4.0 - 10.5 (K/uL)    RBC 3.42 (*) 4.22 - 5.81 (MIL/uL)    Hemoglobin 10.4 (*) 13.0 - 17.0 (g/dL)    HCT 62.9 (*) 52.8 - 52.0 (%)    MCV 91.2  78.0 - 100.0 (fL)    MCH 30.4  26.0 - 34.0 (pg)    MCHC 33.3  30.0 - 36.0 (g/dL)    RDW 41.3  24.4 - 01.0 (%)    Platelets 125 (*) 150 - 400 (K/uL)   BASIC METABOLIC PANEL     Status: Abnormal   Collection Time   09/11/11  5:10 AM      Component Value Range Comment   Sodium 140  135 - 145 (mEq/L)    Potassium 4.4  3.5 - 5.1 (mEq/L)    Chloride 107  96 - 112 (mEq/L)    CO2 21  19 - 32 (mEq/L)    Glucose, Bld 124 (*) 70 - 99 (mg/dL)    BUN 27 (*) 6 - 23 (mg/dL)    Creatinine, Ser 2.72 (*) 0.50 - 1.35 (mg/dL)    Calcium 8.5  8.4 - 10.5 (mg/dL)    GFR calc non Af Amer 25 (*) >90 (mL/min)    GFR calc Af Amer 29 (*) >90 (mL/min)   PROTIME-INR     Status: Abnormal   Collection Time   09/11/11  5:10 AM      Component Value Range Comment   Prothrombin Time 40.6 (*) 11.6 - 15.2 (seconds)    INR 4.13 (*) 0.00 - 1.49    GLUCOSE, CAPILLARY     Status: Abnormal   Collection Time   09/11/11  7:30 AM      Component Value Range Comment   Glucose-Capillary 111 (*) 70 - 99 (mg/dL)    Comment 1 Notify RN     GLUCOSE, CAPILLARY     Status: Abnormal   Collection Time   09/11/11 12:12 PM      Component  Value Range Comment   Glucose-Capillary 280 (*) 70 - 99 (mg/dL)   GLUCOSE, CAPILLARY     Status:  Abnormal   Collection Time   09/11/11  4:51 PM      Component Value Range Comment   Glucose-Capillary 179 (*) 70 - 99 (mg/dL)    Comment 1 Notify RN     GLUCOSE, CAPILLARY     Status: Abnormal   Collection Time   09/11/11  9:07 PM      Component Value Range Comment   Glucose-Capillary 192 (*) 70 - 99 (mg/dL)   PROTIME-INR     Status: Abnormal   Collection Time   09/12/11  5:05 AM      Component Value Range Comment   Prothrombin Time 36.3 (*) 11.6 - 15.2 (seconds)    INR 3.58 (*) 0.00 - 1.49    GLUCOSE, CAPILLARY     Status: Abnormal   Collection Time   09/12/11  7:37 AM      Component Value Range Comment   Glucose-Capillary 110 (*) 70 - 99 (mg/dL)    Comment 1 Notify RN     GLUCOSE, CAPILLARY     Status: Abnormal   Collection Time   09/12/11 11:36 AM      Component Value Range Comment   Glucose-Capillary 186 (*) 70 - 99 (mg/dL)     Dg Chest 2 View  40/98/1191  *RADIOLOGY REPORT*  Clinical Data: Altered mental status.  History of diabetes.  CHEST - 2 VIEW  Comparison: Chest radiograph performed 06/12/2006  Findings: The lungs are hypoexpanded.  Vague streaky opacity is noted at the right hilum and left lung base.  This could reflect mild pneumonia, particularly given the appearance on the lateral view.  There is no evidence of pleural effusion or pneumothorax. There is no definite evidence for significant edema.  The heart is normal in size; the patient is status post median sternotomy, with evidence of prior CABG.  No acute osseous abnormalities are seen.  IMPRESSION: Vague streaky opacities at the right hilum and left lung base, raising concern for mild pneumonia, particularly given the appearance on the lateral view; lungs hypoexpanded.  Original Report Authenticated By: Tonia Ghent, M.D.   Ct Head Wo Contrast  09/10/2011  *RADIOLOGY REPORT*  Clinical Data: Altered mental status.  CT HEAD WITHOUT CONTRAST  Technique:  Contiguous axial images were obtained from the base of the  skull through the vertex without contrast.  Comparison: 04/01/2006  Findings: There is no evidence for acute infarction, intracranial hemorrhage, mass lesion, hydrocephalus, or extra-axial fluid. Moderate atrophy is present. There is chronic microvascular ischemic change  present in the periventricular and subcortical white matter.  Calvarium intact.  Moderate vascular calcification.  Clear sinuses and mastoids.  Little change from priors.  IMPRESSION: Atrophy and small vessel disease.  No acute intracranial findings.  Original Report Authenticated By: Elsie Stain, M.D.     Diet: Diabetic Diet  Activity: Resume as tolerated.  Follow-up Appts: Discharge Orders    Future Orders Please Complete By Expires   Diet Carb Modified      Increase activity slowly      Discharge instructions      Comments:   Followup with Ralene Ok, MD, MD in 1 week. No driving until seen by Ralene Ok, MD, MD Home health RN to draw PT/INR on 09/13/2011.  Please send results to [facility], MD, MD. Hold Coumadin until PT/INR is drawn on 09/13/2011.        Time spent  on discharge, talking to the patient, and coordinating care: 40 mins.   Signed: Cristal Ford, MD 09/12/2011, 12:40 PM

## 2011-09-16 LAB — CULTURE, BLOOD (ROUTINE X 2)
Culture  Setup Time: 201211120909
Culture  Setup Time: 201211120909
Culture: NO GROWTH

## 2011-09-17 LAB — CULTURE, BLOOD (ROUTINE X 2)
Culture  Setup Time: 201211121156
Culture  Setup Time: 201211121156
Culture: NO GROWTH

## 2011-11-20 ENCOUNTER — Other Ambulatory Visit: Payer: Self-pay | Admitting: Internal Medicine

## 2011-11-20 ENCOUNTER — Ambulatory Visit
Admission: RE | Admit: 2011-11-20 | Discharge: 2011-11-20 | Disposition: A | Discharge status: Home / Self Care | Payer: Medicare Other | Source: Ambulatory Visit | Attending: Internal Medicine | Admitting: Internal Medicine

## 2011-11-20 DIAGNOSIS — R0602 Shortness of breath: Secondary | ICD-10-CM

## 2012-05-12 ENCOUNTER — Ambulatory Visit
Admission: RE | Admit: 2012-05-12 | Discharge: 2012-05-12 | Disposition: A | Discharge status: Home / Self Care | Payer: Medicare Other | Source: Ambulatory Visit | Attending: Internal Medicine | Admitting: Internal Medicine

## 2012-05-12 ENCOUNTER — Other Ambulatory Visit: Payer: Self-pay | Admitting: Internal Medicine

## 2012-05-12 DIAGNOSIS — R269 Unspecified abnormalities of gait and mobility: Secondary | ICD-10-CM

## 2012-07-24 ENCOUNTER — Other Ambulatory Visit: Payer: Self-pay | Admitting: Dermatology

## 2012-11-04 ENCOUNTER — Emergency Department (HOSPITAL_COMMUNITY): Payer: Medicare Other

## 2012-11-04 ENCOUNTER — Inpatient Hospital Stay (HOSPITAL_COMMUNITY)
Admission: EM | Admit: 2012-11-04 | Discharge: 2012-11-07 | DRG: 682 | Disposition: A | Discharge status: Hospice | Payer: Medicare Other | Attending: Internal Medicine | Admitting: Internal Medicine

## 2012-11-04 ENCOUNTER — Encounter (HOSPITAL_COMMUNITY): Payer: Self-pay | Admitting: *Deleted

## 2012-11-04 DIAGNOSIS — F419 Anxiety disorder, unspecified: Secondary | ICD-10-CM

## 2012-11-04 DIAGNOSIS — D689 Coagulation defect, unspecified: Secondary | ICD-10-CM

## 2012-11-04 DIAGNOSIS — I2510 Atherosclerotic heart disease of native coronary artery without angina pectoris: Secondary | ICD-10-CM | POA: Diagnosis present

## 2012-11-04 DIAGNOSIS — R638 Other symptoms and signs concerning food and fluid intake: Secondary | ICD-10-CM

## 2012-11-04 DIAGNOSIS — Z794 Long term (current) use of insulin: Secondary | ICD-10-CM

## 2012-11-04 DIAGNOSIS — D631 Anemia in chronic kidney disease: Secondary | ICD-10-CM

## 2012-11-04 DIAGNOSIS — R791 Abnormal coagulation profile: Secondary | ICD-10-CM | POA: Diagnosis present

## 2012-11-04 DIAGNOSIS — F0390 Unspecified dementia without behavioral disturbance: Secondary | ICD-10-CM | POA: Diagnosis present

## 2012-11-04 DIAGNOSIS — G9340 Encephalopathy, unspecified: Secondary | ICD-10-CM

## 2012-11-04 DIAGNOSIS — N058 Unspecified nephritic syndrome with other morphologic changes: Secondary | ICD-10-CM | POA: Diagnosis present

## 2012-11-04 DIAGNOSIS — M545 Low back pain, unspecified: Secondary | ICD-10-CM | POA: Diagnosis present

## 2012-11-04 DIAGNOSIS — E1129 Type 2 diabetes mellitus with other diabetic kidney complication: Secondary | ICD-10-CM | POA: Diagnosis present

## 2012-11-04 DIAGNOSIS — E875 Hyperkalemia: Secondary | ICD-10-CM | POA: Diagnosis present

## 2012-11-04 DIAGNOSIS — I129 Hypertensive chronic kidney disease with stage 1 through stage 4 chronic kidney disease, or unspecified chronic kidney disease: Secondary | ICD-10-CM | POA: Diagnosis present

## 2012-11-04 DIAGNOSIS — Z8249 Family history of ischemic heart disease and other diseases of the circulatory system: Secondary | ICD-10-CM

## 2012-11-04 DIAGNOSIS — F411 Generalized anxiety disorder: Secondary | ICD-10-CM | POA: Diagnosis present

## 2012-11-04 DIAGNOSIS — I951 Orthostatic hypotension: Secondary | ICD-10-CM | POA: Diagnosis present

## 2012-11-04 DIAGNOSIS — T45515A Adverse effect of anticoagulants, initial encounter: Secondary | ICD-10-CM | POA: Diagnosis present

## 2012-11-04 DIAGNOSIS — N179 Acute kidney failure, unspecified: Principal | ICD-10-CM

## 2012-11-04 DIAGNOSIS — Z66 Do not resuscitate: Secondary | ICD-10-CM | POA: Diagnosis present

## 2012-11-04 DIAGNOSIS — E860 Dehydration: Secondary | ICD-10-CM

## 2012-11-04 DIAGNOSIS — Z515 Encounter for palliative care: Secondary | ICD-10-CM

## 2012-11-04 DIAGNOSIS — K117 Disturbances of salivary secretion: Secondary | ICD-10-CM

## 2012-11-04 DIAGNOSIS — N039 Chronic nephritic syndrome with unspecified morphologic changes: Secondary | ICD-10-CM

## 2012-11-04 DIAGNOSIS — G92 Toxic encephalopathy: Secondary | ICD-10-CM

## 2012-11-04 DIAGNOSIS — N189 Chronic kidney disease, unspecified: Secondary | ICD-10-CM

## 2012-11-04 DIAGNOSIS — Z8701 Personal history of pneumonia (recurrent): Secondary | ICD-10-CM

## 2012-11-04 DIAGNOSIS — G929 Unspecified toxic encephalopathy: Secondary | ICD-10-CM

## 2012-11-04 DIAGNOSIS — Z79899 Other long term (current) drug therapy: Secondary | ICD-10-CM

## 2012-11-04 DIAGNOSIS — Z951 Presence of aortocoronary bypass graft: Secondary | ICD-10-CM

## 2012-11-04 DIAGNOSIS — Z7901 Long term (current) use of anticoagulants: Secondary | ICD-10-CM

## 2012-11-04 DIAGNOSIS — I10 Essential (primary) hypertension: Secondary | ICD-10-CM | POA: Diagnosis present

## 2012-11-04 DIAGNOSIS — I4891 Unspecified atrial fibrillation: Secondary | ICD-10-CM | POA: Diagnosis present

## 2012-11-04 HISTORY — DX: Unspecified dementia, unspecified severity, without behavioral disturbance, psychotic disturbance, mood disturbance, and anxiety: F03.90

## 2012-11-04 HISTORY — DX: Altered mental status, unspecified: R41.82

## 2012-11-04 HISTORY — DX: Type 2 diabetes mellitus without complications: E11.9

## 2012-11-04 LAB — CG4 I-STAT (LACTIC ACID): Lactic Acid, Venous: 2.15 mmol/L (ref 0.5–2.2)

## 2012-11-04 LAB — CBC WITH DIFFERENTIAL/PLATELET
Eosinophils Absolute: 0.1 10*3/uL (ref 0.0–0.7)
Eosinophils Relative: 1 % (ref 0–5)
HCT: 40.3 % (ref 39.0–52.0)
Lymphocytes Relative: 16 % (ref 12–46)
Lymphs Abs: 1.8 10*3/uL (ref 0.7–4.0)
MCH: 29.8 pg (ref 26.0–34.0)
MCV: 89 fL (ref 78.0–100.0)
Monocytes Absolute: 0.8 10*3/uL (ref 0.1–1.0)
Platelets: 178 10*3/uL (ref 150–400)
RBC: 4.53 MIL/uL (ref 4.22–5.81)

## 2012-11-04 LAB — COMPREHENSIVE METABOLIC PANEL
ALT: 7 U/L (ref 0–53)
BUN: 117 mg/dL — ABNORMAL HIGH (ref 6–23)
CO2: 15 mEq/L — ABNORMAL LOW (ref 19–32)
Calcium: 9.7 mg/dL (ref 8.4–10.5)
Creatinine, Ser: 7.94 mg/dL — ABNORMAL HIGH (ref 0.50–1.35)
GFR calc Af Amer: 6 mL/min — ABNORMAL LOW (ref >90)
GFR calc non Af Amer: 6 mL/min — ABNORMAL LOW (ref >90)
Glucose, Bld: 120 mg/dL — ABNORMAL HIGH (ref 70–99)
Sodium: 140 mEq/L (ref 135–145)
Total Protein: 7.8 g/dL (ref 6.0–8.3)

## 2012-11-04 LAB — PROTIME-INR
INR: 3.87 — ABNORMAL HIGH (ref 0.00–1.49)
Prothrombin Time: 35.7 seconds — ABNORMAL HIGH (ref 11.6–15.2)

## 2012-11-04 LAB — ETHANOL: Alcohol, Ethyl (B): <11 mg/dL (ref 0–11)

## 2012-11-04 LAB — POCT I-STAT TROPONIN I: Troponin i, poc: 0.06 ng/mL (ref 0.00–0.08)

## 2012-11-04 MED ORDER — CALCIUM GLUCONATE 10 % IV SOLN
1.0000 g | Freq: Once | INTRAVENOUS | Status: AC
  Administered 2012-11-04: 1 g via INTRAVENOUS
  Filled 2012-11-04: qty 10

## 2012-11-04 MED ORDER — DOCUSATE SODIUM 100 MG PO CAPS: 100.0000 mg | ORAL_CAPSULE | Freq: Every day | ORAL | Status: DC | PRN

## 2012-11-04 MED ORDER — ACETAMINOPHEN 650 MG RE SUPP: 650.0000 mg | Freq: Four times a day (QID) | RECTAL | Status: DC | PRN

## 2012-11-04 MED ORDER — SODIUM CHLORIDE 0.9 % IV SOLN
1.0000 g | Freq: Once | INTRAVENOUS | Status: DC
  Filled 2012-11-04: qty 10

## 2012-11-04 MED ORDER — SODIUM CHLORIDE 0.9 % IV SOLN
Freq: Once | INTRAVENOUS | Status: AC
  Administered 2012-11-04: 17:00:00 via INTRAVENOUS

## 2012-11-04 MED ORDER — MORPHINE SULFATE 2 MG/ML IJ SOLN
2.0000 mg | INTRAMUSCULAR | Status: DC | PRN
  Administered 2012-11-04 – 2012-11-06 (×6): 2 mg via INTRAVENOUS
  Filled 2012-11-04 (×7): qty 1

## 2012-11-04 MED ORDER — PANTOPRAZOLE SODIUM 40 MG IV SOLR
40.0000 mg | INTRAVENOUS | Status: DC
  Administered 2012-11-04 – 2012-11-06 (×3): 40 mg via INTRAVENOUS
  Filled 2012-11-04 (×4): qty 40

## 2012-11-04 MED ORDER — INSULIN ASPART 100 UNIT/ML ~~LOC~~ SOLN: 10.0000 [IU] | Freq: Once | SUBCUTANEOUS | Status: DC

## 2012-11-04 MED ORDER — SODIUM CHLORIDE 0.9 % IV SOLN
INTRAVENOUS | Status: AC
  Administered 2012-11-04: 21:00:00 via INTRAVENOUS

## 2012-11-04 MED ORDER — DEXTROSE 5 % IV BOLUS: 1000.0000 mL | Freq: Once | INTRAVENOUS | Status: DC

## 2012-11-04 MED ORDER — SODIUM CHLORIDE 0.9 % IV BOLUS (SEPSIS)
500.0000 mL | Freq: Once | INTRAVENOUS | Status: AC
  Administered 2012-11-04: 500 mL via INTRAVENOUS

## 2012-11-04 MED ORDER — HYDRALAZINE HCL 20 MG/ML IJ SOLN
10.0000 mg | Freq: Four times a day (QID) | INTRAMUSCULAR | Status: DC | PRN
  Filled 2012-11-04: qty 0.5

## 2012-11-04 MED ORDER — TRAMADOL HCL 50 MG PO TABS
50.0000 mg | ORAL_TABLET | Freq: Two times a day (BID) | ORAL | Status: DC
  Filled 2012-11-04 (×3): qty 1

## 2012-11-04 MED ORDER — ACETAMINOPHEN 325 MG PO TABS: 650.0000 mg | ORAL_TABLET | Freq: Four times a day (QID) | ORAL | Status: DC | PRN

## 2012-11-04 NOTE — H&P (Signed)
[facility] History and Physical  Charles Guzman ZOX:[id number] DOB: 1929/02/09 DOA: 11/04/2012  Referring physician: ER physician PCP: Ralene Ok, MD   Chief Complaint: altered mental status  HPI:  79 year old male with past medical history of dementia, renal failure, atrial fibrillation (on coumadin), HTN, DM who was brought in by family due to worsening mental status, poor oral intake and progressive decline. Patient is not a good historian due to his mental status and family is not currently available to provide details of patient's illness. In ED, patient was found to have worsening creatinine of 7.94 (in 08/2012 creatinine was 2.95), hyperkalemia of more than 7.5 and [facility] was asked to admit for further management. Patient's family reported to ED physician that they wanted to continue comfort care only, no further interventions.  Assessment and Plan:  Principal Problem:  *Acute encephalopathy  Secondary to worsening dementia, progressive decline  Family wishes to continue comfort care   Will get PT/OT eval which although appropriate may not be accomplished due to patient's mental status  Active Problems:  Dehydration, severe  Secondary to poor oral intake, worsening dementia  Acute on chronic renal failure  Worsening renal failure but family only wants IV fluids, no other interventions  DM (diabetes mellitus) type II controlled with renal manifestation  Sliding scale only, pt is NPO  Hypertension  Will order IV hydralazine PRN for BP control  Coagulopathy  Secondary to coumadin  Hold coumadin for now and since family wishes to continue comfort care only it may be reasonable not to continue coumadin even if INR therapeutic  Hyperkalemia  Given calcium gluconate x 1 dose  Follow up BMP  Code Status: Full Family Communication: Pt at bedside Disposition Plan: Admit for further management; per family wishes for palliative care  Manson Passey,  MD  [facility] Pager [phone]  If 7PM-7AM, please contact night-coverage [email or web address] Password [facility] 11/04/2012, 7:17 PM   Review of Systems:  Unable to obtain due to altered mental status; patient has dementia  Past Medical History  Diagnosis Date  . Chronic kidney disease   . Coronary artery disease   . Hypertension   . GERD (gastroesophageal reflux disease)   . Pneumonia   . Diabetes mellitus   . Anemia    Past Surgical History  Procedure Date  . Coronary artery bypass graft   . Joint replacement     knees   Social History:  reports that he has never smoked. His smokeless tobacco use includes Chew. He reports that he does not drink alcohol or use illicit drugs.  No Known Allergies  Family History:  Family History  Problem Relation Age of Onset  . Coronary artery disease Father   . Coronary artery disease Brother      Prior to Admission medications   Medication Sig Start Date End Date Taking? Authorizing Provider  amitriptyline (ELAVIL) 10 MG tablet Take 10 mg by mouth at bedtime. Depression     Yes Historical Provider, MD  atorvastatin (LIPITOR) 20 MG tablet Take 20 mg by mouth daily.     Yes Historical Provider, MD  furosemide (LASIX) 40 MG tablet Take 1 tablet (40 mg total) by mouth daily. 09/12/11  Yes Srikar Cherlynn Kaiser, MD  insulin detemir (LEVEMIR) 100 UNIT/ML injection Inject 40 Units into the skin at bedtime.     Yes Historical Provider, MD  insulin lispro (HUMALOG) 100 UNIT/ML injection Inject into the skin 3 (three) times daily before meals. Sliding scale    Yes Historical  Provider, MD  lisinopril (PRINIVIL,ZESTRIL) 10 MG tablet Take 10 mg by mouth daily.     Yes Historical Provider, MD  metoprolol (TOPROL-XL) 50 MG 24 hr tablet Take 50 mg by mouth 2 (two) times daily.     Yes Historical Provider, MD  potassium chloride (KLOR-CON) 10 MEQ CR tablet Take 10 mEq by mouth daily.     Yes Historical Provider, MD  ranitidine (ZANTAC) 300 MG tablet Take 300 mg by mouth  daily.     Yes Historical Provider, MD  sitaGLIPtin (JANUVIA) 100 MG tablet Take 100 mg by mouth daily.     Yes Historical Provider, MD  traMADol (ULTRAM) 50 MG tablet Take 50 mg by mouth 2 (two) times daily. Maximum dose= 8 tablets per day    Yes Historical Provider, MD  warfarin (COUMADIN) 3 MG tablet Take 1 tablet (3 mg total) by mouth daily. Daughter states he takes 3mg  tab every day except Saturday and Sunday he takes two of the 3mg  tab.  Please hold Coumadin until 09/13/2011, until PT/INR is drawn. 09/12/11  Yes Cristal Ford, MD   Physical Exam: Filed Vitals:   11/04/12 1630 11/04/12 1700 11/04/12 1800 11/04/12 1913  BP: 101/63 107/49 151/132 120/63  Pulse: 61 68 73 73  Temp:    97.1 F (36.2 C)  TempSrc:    Axillary  Resp: 13 13 16 20   Height:    5\' 4"  (1.626 m)  Weight:    88.225 kg (194 lb 8 oz)  SpO2: 100% 100% 100% 100%    Physical Exam  Constitutional: Appears very ill  HENT: Normocephalic. Dry mucus membrnaes Eyes: Conjunctivae and EOM are normal. PERRLA, no scleral icterus.  Neck: Normal ROM. Neck supple. No JVD CVS: (+) S1, S2, irregular rhythm, rate controlled Pulmonary: decreased breath sounds bilaterally, poor respiratory effort by patient Abdominal: Soft. BS +,  no distension, tenderness, rebound or guarding.  Musculoskeletal: No edema, no tenderness Lymphadenopathy: No lymphadenopathy noted, cervical, inguinal. Neuro: Awake, no focal neurologic deficits Skin: Skin is warm and dry. No rash noted. Not diaphoretic. No erythema. No pallor.    Labs on Admission:  Basic Metabolic Panel:  Lab 11/04/12 8295  NA 140  K >7.5*  CL 108  CO2 15*  GLUCOSE 120*  BUN 117*  CREATININE 7.94*  CALCIUM 9.7  MG --  PHOS --   Liver Function Tests:  Lab 11/04/12 1456  AST 14  ALT 7  ALKPHOS 81  BILITOT 0.4  PROT 7.8  ALBUMIN 3.8   CBC:  Lab 11/04/12 1456  WBC 11.3*  NEUTROABS 8.5*  HGB 13.5  HCT 40.3  MCV 89.0  PLT 178   Radiological Exams on  Admission: Dg Chest Portable 1 View 11/04/2012  * IMPRESSION: Low lung volumes.  Elevation of the right hemidiaphragm with bibasilar atelectasis.  Mild vascular congestion.   Original Report Authenticated By: Charlett Nose, M.D.     Time spent: 80 minutes

## 2012-11-04 NOTE — ED Notes (Signed)
Per EMS: Pt from home. Family reports deteriorating mental status x 1 wk. Pt denies any complaint, pain.  Pt AO x 1. Initial O2 80% RA. 92% 4L [place]. Pt mumbling, unclear confused speech. Pt cooperative at this time. VSS. 76 A fib. 137/115. 100 BG.

## 2012-11-04 NOTE — Progress Notes (Signed)
Pt transferred to unit from [facility] ED, received report from [address], [place] in the ED at 18:50. Patient arrived to unit 6700 at 19:00.  Pt A&O to self only, VS's WNL. Family at bedside, they were oriented to staff, call bell, and room. Bed in lowest position. Call bell within reach. Full report given to nightshift nurse.  Fayne Norrie, RN

## 2012-11-04 NOTE — ED Provider Notes (Addendum)
History     CSN: [id number]  Arrival date & time 11/04/12  1353   First MD Initiated Contact with Patient 11/04/12 1500      Chief Complaint  Patient presents with  . Altered Mental Status    (Consider location/radiation/quality/duration/timing/severity/associated sxs/prior treatment) HPI The patient presents with his son and daughter who provide the history of present illness due to the patient's history of dementia, and is currently mental status.  This is a level V caveat. According to the family, the patient has become increasingly less responsive over the past 2 weeks.  At baseline the patient is ambulatory, walks, without difficulty, freely.  He also is conversant, seemingly appropriately interactive. The patient's family states that these changes began gradually, since onset has been progressive, with no clear exacerbating or alleviating factors. There is concurrent anorexia, decreased fluid intake, although the urea.  The patient has had multiple bowel movements over the past days. No report of fever, chills, cough.   Past Medical History  Diagnosis Date  . Chronic kidney disease   . Coronary artery disease   . Hypertension   . GERD (gastroesophageal reflux disease)   . Pneumonia   . Diabetes mellitus   . Anemia     Past Surgical History  Procedure Date  . Coronary artery bypass graft   . Joint replacement     knees    Family History  Problem Relation Age of Onset  . Coronary artery disease Father   . Coronary artery disease Brother     History  Substance Use Topics  . Smoking status: Never Smoker   . Smokeless tobacco: Current User    Types: Chew     Comment: Pt to confused and acutely ill to discuss.  . Alcohol Use: No      Review of Systems  Unable to perform ROS: Dementia    Allergies  Review of patient's allergies indicates no known allergies.  Home Medications   Current Outpatient Rx  Name  Route  Sig  Dispense  Refill  . AMITRIPTYLINE  HCL 10 MG PO TABS   Oral   Take 10 mg by mouth at bedtime. Depression           . ATORVASTATIN CALCIUM 20 MG PO TABS   Oral   Take 20 mg by mouth daily.           . FUROSEMIDE 40 MG PO TABS   Oral   Take 1 tablet (40 mg total) by mouth daily.   30 tablet   0   . INSULIN DETEMIR 100 UNIT/ML [place] SOLN   Subcutaneous   Inject 40 Units into the skin at bedtime.           . INSULIN LISPRO (HUMAN) 100 UNIT/ML [place] SOLN   Subcutaneous   Inject into the skin 3 (three) times daily before meals. Sliding scale          . LISINOPRIL 10 MG PO TABS   Oral   Take 10 mg by mouth daily.           Marland Kitchen METOPROLOL SUCCINATE ER 50 MG PO TB24   Oral   Take 50 mg by mouth 2 (two) times daily.           Marland Kitchen POTASSIUM CHLORIDE 10 MEQ PO TBCR   Oral   Take 10 mEq by mouth daily.           Marland Kitchen RANITIDINE HCL 300 MG PO TABS   Oral  Take 300 mg by mouth daily.           Marland Kitchen SITAGLIPTIN PHOSPHATE 100 MG PO TABS   Oral   Take 100 mg by mouth daily.           . TRAMADOL HCL 50 MG PO TABS   Oral   Take 50 mg by mouth 2 (two) times daily. Maximum dose= 8 tablets per day          . WARFARIN SODIUM 3 MG PO TABS   Oral   Take 1 tablet (3 mg total) by mouth daily. Daughter states he takes 3mg  tab every day except Saturday and Sunday he takes two of the 3mg  tab.  Please hold Coumadin until 09/13/2011, until PT/INR is drawn.           BP 103/56  Pulse 58  Temp 97 F (36.1 C) (Rectal)  Resp 13  SpO2 100%  Physical Exam  Nursing note and vitals reviewed. Constitutional: He appears listless. He appears ill.  HENT:  Head: Normocephalic and atraumatic.  Mouth/Throat: Mucous membranes are dry.       Edentulous  Eyes:       Patient will open eyes to command, does not track appropriately.  Neck: No thyromegaly present.  Cardiovascular: Regular rhythm.   Pulmonary/Chest: No stridor. He has decreased breath sounds.  Abdominal: Soft.  Musculoskeletal:       Mild atrophy,  symmetrically.  The patient moves all extremities spontaneously, but does not cooperate in range of motion or strength exam  Neurological: He appears listless. He displays normal reflexes.       Patient does not cooperate with neurologic exam, including no appropriate interactivity with strength or range of motion testing, though he moves all extremities spontaneously, seemingly symmetrically with resistance to passive stretch.  Psychiatric: He is withdrawn. Cognition and memory are impaired. He exhibits abnormal recent memory and abnormal remote memory.       Patient was found minimally, verbally to painful stimuli    ED Course  Procedures (including critical care time)  Labs Reviewed  CBC WITH DIFFERENTIAL - Abnormal; Notable for the following:    WBC 11.3 (*)     Neutro Abs 8.5 (*)     All other components within normal limits  PROTIME-INR - Abnormal; Notable for the following:    Prothrombin Time 35.7 (*)     INR 3.87 (*)     All other components within normal limits  CG4 I-STAT (LACTIC ACID)  POCT I-STAT TROPONIN I  COMPREHENSIVE METABOLIC PANEL  URINALYSIS, ROUTINE W REFLEX MICROSCOPIC  CULTURE, BLOOD (ROUTINE X 2)  CULTURE, BLOOD (ROUTINE X 2)  ETHANOL  DRUGS OF ABUSE SCREEN W ALC, ROUTINE URINE   Dg Chest Portable 1 View  11/04/2012  *RADIOLOGY REPORT*  Clinical Data: Altered mental status.  PORTABLE CHEST - 1 VIEW  Comparison: 11/20/2011  Findings: Elevation of the right hemidiaphragm.  Bibasilar atelectasis.  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  Heart is upper limits normal in size.  Mild vascular congestion and peribronchial thickening.  No effusions.  No acute bony abnormality.  IMPRESSION: Low lung volumes.  Elevation of the right hemidiaphragm with bibasilar atelectasis.  Mild vascular congestion.   Original Report Authenticated By: Charlett Nose, M.D.      No diagnosis found.  Cardiac 65 sinus rhythm normal Pulse ox 96% nasal cannula abnormal    Date: 11/04/2012   Rate: 63  Rhythm: normal sinus rhythm  QRS Axis: left  Intervals: PR prolonged  ST/T Wave abnormalities: nonspecific T wave changes  Conduction Disutrbances:first-degree A-V block  and nonspecific intraventricular conduction delay  Narrative Interpretation:   Old EKG Reviewed: unchanged ABNORMAL  4:17 PM Patient w K>7, new renal failure.  meds started, ns continued.  Family informed of results.  We clarified goals of care w family.  Update:   Family now advises that the patient would prefer no resuscitation beyond fluids / comfort care.  We discussed the spectrum of interventions available and additional meds beyond fluids will not be provided.   Update: Patient appears in NAD. MDM  This elderly male with dementia presents with family members or history of decreased interactivity, increased anorexia over the past weeks.  On exam the patient is listless, though overtly neurologically intact.  The patient's hypotensive.  Given the hypotension, altered mental status, Coumadin use, the differential is broad.  Initial results demonstrate that the patient had new renal failure, hyperkalemia.  Following return of these labs, which the patient's decreased cognitive state, I discussed goals of care with the family at length.  We discussed the spectrum interventions available from cessation of the IV fluids to emergent dialysis.  The patient's daughter states that patient would not want any prolonged resuscitation, or acute intervention.  With this knowledge, acute interventions were stopped, fluids were continued in an effort to provide comfort.  The patient was admitted for likely palliative services.    CRITICAL CARE Performed by: Gerhard Munch   Total critical care time: 40  Critical care time was exclusive of separately billable procedures and treating other patients.  Critical care was necessary to treat or prevent imminent or life-threatening deterioration.  Critical care was time  spent personally by me on the following activities: development of treatment plan with patient and/or surrogate as well as nursing, discussions with consultants, evaluation of patient's response to treatment, examination of patient, obtaining history from patient or surrogate, ordering and performing treatments and interventions, ordering and review of laboratory studies, ordering and review of radiographic studies, pulse oximetry and re-evaluation of patient's condition.     Gerhard Munch, MD 11/04/12 2014    Gerhard Munch, MD 11/04/12 2351

## 2012-11-04 NOTE — ED Notes (Addendum)
Per family pt has experienced similar episodes of confusion in the past that resolved. Family reports pt has decreased appetite x 2 wks and "he keeps leaning to the right."

## 2012-11-04 NOTE — ED Notes (Signed)
MD Lockwood at bedside.  

## 2012-11-05 ENCOUNTER — Encounter (HOSPITAL_COMMUNITY): Payer: Self-pay | Admitting: General Practice

## 2012-11-05 DIAGNOSIS — G9340 Encephalopathy, unspecified: Secondary | ICD-10-CM

## 2012-11-05 DIAGNOSIS — K1370 Unspecified lesions of oral mucosa: Secondary | ICD-10-CM

## 2012-11-05 DIAGNOSIS — F419 Anxiety disorder, unspecified: Secondary | ICD-10-CM

## 2012-11-05 DIAGNOSIS — R638 Other symptoms and signs concerning food and fluid intake: Secondary | ICD-10-CM

## 2012-11-05 DIAGNOSIS — M545 Low back pain, unspecified: Secondary | ICD-10-CM

## 2012-11-05 DIAGNOSIS — E875 Hyperkalemia: Secondary | ICD-10-CM

## 2012-11-05 DIAGNOSIS — K117 Disturbances of salivary secretion: Secondary | ICD-10-CM

## 2012-11-05 DIAGNOSIS — D689 Coagulation defect, unspecified: Secondary | ICD-10-CM

## 2012-11-05 LAB — URINALYSIS, ROUTINE W REFLEX MICROSCOPIC
Ketones, ur: NEGATIVE mg/dL
Leukocytes, UA: NEGATIVE
Nitrite: NEGATIVE
Specific Gravity, Urine: 1.02 (ref 1.005–1.030)
pH: 5 (ref 5.0–8.0)

## 2012-11-05 LAB — BASIC METABOLIC PANEL
Chloride: 117 mEq/L — ABNORMAL HIGH (ref 96–112)
GFR calc Af Amer: 7 mL/min — ABNORMAL LOW (ref >90)
Potassium: 7 mEq/L (ref 3.5–5.1)
Sodium: 142 mEq/L (ref 135–145)

## 2012-11-05 LAB — GLUCOSE, CAPILLARY: Glucose-Capillary: 135 mg/dL — ABNORMAL HIGH (ref 70–99)

## 2012-11-05 LAB — CBC
HCT: 35.8 % — ABNORMAL LOW (ref 39.0–52.0)
Hemoglobin: 11.7 g/dL — ABNORMAL LOW (ref 13.0–17.0)
RBC: 3.98 MIL/uL — ABNORMAL LOW (ref 4.22–5.81)
RDW: 15.4 % (ref 11.5–15.5)
WBC: 8.8 10*3/uL (ref 4.0–10.5)

## 2012-11-05 LAB — URINE MICROSCOPIC-ADD ON

## 2012-11-05 MED ORDER — SODIUM CHLORIDE 0.9 % IV SOLN
INTRAVENOUS | Status: DC
  Administered 2012-11-05: 200 mL via INTRAVENOUS

## 2012-11-05 MED ORDER — STERILE WATER FOR INJECTION IV SOLN
INTRAVENOUS | Status: DC
  Administered 2012-11-05: 09:00:00 via INTRAVENOUS
  Filled 2012-11-05 (×2): qty 900

## 2012-11-05 MED ORDER — ATROPINE SULFATE 1 % OP SOLN
2.0000 [drp] | OPHTHALMIC | Status: DC | PRN
  Filled 2012-11-05: qty 2

## 2012-11-05 MED ORDER — SODIUM POLYSTYRENE SULFONATE 15 GM/60ML PO SUSP
15.0000 g | Freq: Once | ORAL | Status: DC
  Filled 2012-11-05 (×2): qty 60

## 2012-11-05 MED ORDER — LORAZEPAM 2 MG/ML IJ SOLN: 2.0000 mg | INTRAMUSCULAR | Status: DC | PRN

## 2012-11-05 MED ORDER — LORAZEPAM 2 MG/ML IJ SOLN: 0.5000 mg | INTRAMUSCULAR | Status: DC | PRN

## 2012-11-05 MED ORDER — BISACODYL 10 MG RE SUPP: 10.0000 mg | Freq: Every day | RECTAL | Status: DC | PRN

## 2012-11-05 MED ORDER — DOCUSATE SODIUM 100 MG PO CAPS
100.0000 mg | ORAL_CAPSULE | Freq: Two times a day (BID) | ORAL | Status: DC
  Filled 2012-11-05: qty 1

## 2012-11-05 MED ORDER — MORPHINE SULFATE (CONCENTRATE) 20 MG/ML PO SOLN: 5.0000 mg | ORAL | Status: DC | PRN

## 2012-11-05 MED ORDER — DEXTROSE 50 % IV SOLN
INTRAVENOUS | Status: AC
  Administered 2012-11-05: 50 mL
  Filled 2012-11-05: qty 50

## 2012-11-05 MED ORDER — SODIUM POLYSTYRENE SULFONATE 15 GM/60ML PO SUSP
15.0000 g | Freq: Once | ORAL | Status: AC
  Administered 2012-11-05: 15 g via RECTAL

## 2012-11-05 NOTE — Progress Notes (Signed)
Notified at 4:45 pm patient's family requesting services of [facility]) at discharge - contacted patient's daughter Babette Relic at home [phone] who reports hopeful to plan for discharge on Friday once all arrangements are in place writer to follow up first thing in the morning with [facility] Elnita Maxwell to assist with needs.  Valente David, RN 11/05/2012, 6:06 PM [facility] Palliative Medicine Team RN Liaison [phone]

## 2012-11-05 NOTE — Progress Notes (Signed)
CRITICAL VALUE ALERT  Critical value received:  Potassium 7  Date of notification:  11/05/2012  Time of notification:  08:36  Critical value read back:yes  Nurse who received alert:  Fayne Norrie, RN  MD notified (1st page):  Dr. Lonia Farber  Time of first page: 08:38  Responding MD:  Dr. Lonia Farber  Time MD responded:  08:40 Orders present for Sodium Bicarbpnate IV infusion and Kaexelate (see MAR).

## 2012-11-05 NOTE — Progress Notes (Signed)
[facility] PROGRESS NOTE  Torrie Lafavor Pennick ZOX:[id number] DOB: Jun 03, 1929 DOA: 11/04/2012 PCP: Ralene Ok, MD  Assessment/Plan: Acute encephalopathy  Multifactorial,secondary to worsening dementia, progressive decline, Renal failure. Will check CT head rule out stroke.  Chest x ray no evidence of infection. Will check UA, urine culture. Will do work up for infectious process in case of reversible cause of encephalopathy.   Dehydration, severe  Secondary to poor oral intake, worsening dementia. IV fluids. Acute on chronic renal failure  Worsening renal failure, probably pre renal in setting decrease volume,  ACE, diuretic  use.  Change IV fluids free water with Bicarb. Strict I and O. Palliative care consult for goals of care.  Continue to hold Lasix and lisinopril.  DM (diabetes mellitus) type II controlled with renal manifestation  Sliding scale , pt is NPO Hypertension  IV hydralazine PRN for BP control Coagulopathy  Secondary to coumadin  Hold coumadin for now. Hyperkalemia  Recieved calcium gluconate x 1 dose  BMP this am pending.  Will give kayexalate. Start IV fluids with Bicarb.    Code Status: DNR. Family Communication: I spoke with Daughter Tammy. She confirm to me DNR status. She agree to speak with Palliative for goals of care.  Disposition Plan: To be determine.    Consultants:  Palliative.   Procedures:  None.   Antibiotics:  None.  HPI/Subjective: 79 year old with PMH dementia brought by family due to AMS, I spoke with Daughter. Mr Friedt has not been eating for last 2 weeks, since 2 days ago he has not been able to recognized her. He has not been able to walk for last week.    Objective: Filed Vitals:   11/04/12 1944 11/04/12 2100 11/05/12 0448 11/05/12 0747  BP:  94/49 122/70 105/61  Pulse:  67 78 68  Temp:  98.2 F (36.8 C) 97.7 F (36.5 C) 98.2 F (36.8 C)  TempSrc:  Axillary Axillary Oral  Resp:  18 18 18   Height:        Weight:      SpO2: 100% 100% 100% 100%    Intake/Output Summary (Last 24 hours) at 11/05/12 0816 Last data filed at 11/05/12 0700  Gross per 24 hour  Intake   3225 ml  Output    950 ml  Net   2275 ml   Filed Weights   11/04/12 1913  Weight: 88.225 kg (194 lb 8 oz)    Exam:   General:  No distress, somnolent.   Cardiovascular: S 1, S 2 RRR  Respiratory: No wheezes, ronchus  Abdomen: Bs present, soft, NR  Neuro: open eyes to voice, move arms and head, not following command, somnolent.   Data Reviewed: Basic Metabolic Panel:  Lab 11/04/12 8119  NA 140  K >7.5*  CL 108  CO2 15*  GLUCOSE 120*  BUN 117*  CREATININE 7.94*  CALCIUM 9.7  MG --  PHOS --   Liver Function Tests:  Lab 11/04/12 1456  AST 14  ALT 7  ALKPHOS 81  BILITOT 0.4  PROT 7.8  ALBUMIN 3.8   CBC:  Lab 11/05/12 0740 11/04/12 1456  WBC 8.8 11.3*  NEUTROABS -- 8.5*  HGB 11.7* 13.5  HCT 35.8* 40.3  MCV 89.9 89.0  PLT 140* 178   Cardiac Enzymes: No results found for this basename: CKTOTAL:5,CKMB:5,CKMBINDEX:5,TROPONINI:5 in the last 168 hours BNP (last 3 results) No results found for this basename: PROBNP:3 in the last 8760 hours CBG:  Lab 11/05/12 0614 11/05/12 0510  GLUCAP  135* 69*    No results found for this or any previous visit (from the past 240 hour(s)).   Studies: Dg Chest Portable 1 View  11/04/2012  *RADIOLOGY REPORT*  Clinical Data: Altered mental status.  PORTABLE CHEST - 1 VIEW  Comparison: 11/20/2011  Findings: Elevation of the right hemidiaphragm.  Bibasilar atelectasis.  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  Heart is upper limits normal in size.  Mild vascular congestion and peribronchial thickening.  No effusions.  No acute bony abnormality.  IMPRESSION: Low lung volumes.  Elevation of the right hemidiaphragm with bibasilar atelectasis.  Mild vascular congestion.   Original Report Authenticated By: Charlett Nose, M.D.     Scheduled Meds:   . sodium chloride    Intravenous STAT  . docusate sodium  100 mg Oral BID  . pantoprazole (PROTONIX) IV  40 mg Intravenous Q24H  . sodium polystyrene  15 g Oral Once   Continuous Infusions:   .  sodium bicarbonate infusion sterile water 1000 mL      Principal Problem:  *Acute encephalopathy Active Problems:  Dehydration, severe  Acute on chronic renal failure  DM (diabetes mellitus) type II controlled with renal manifestation  Hypotension, postural  Hypertension  Coagulopathy  Hyperkalemia    Time spent: 35 minutes.    Delwyn Scoggin  [facility] Pager [phone]. If 8PM-8AM, please contact night-coverage at [email or web address], password [facility] 11/05/2012, 8:16 AM  LOS: 1 day

## 2012-11-05 NOTE — Progress Notes (Signed)
Patient XB:[id number] Charles Guzman      DOB: 12/03/1928      AOZ:[id number]  Met with the following family members this afternoon, daughter Patsi Sears, patients son, Mohd. Derflinger and his wife Misty Stanley. We discussed patients current medical; status and issues ARF, elevated potassium, poor oral intake. Medical Orders for Scope of Treatment form reviewed, signed and placed on chart.  Goals of Care decision:   Code Status: DNR/DNI   Stop aggressive medical interventions and monitoring, focus to be on symptom management and comfort  Agreeable to mainain patient on IVF at [facility] rate for vehicle for IV medications while in hospital  Do not want the use of artificial feeding tubes for nutrition  Requesting hospice choice for home hospice support, patients daughter Babette Relic would be primary caregiver with assistance from her daughter and nephew. Will need equipment sent to home prior to patients discharge. Case manager notified will be offered choice.   MOST and DNR goldenrod form placed on chart. Full Palliative Care note to follow.   Freddie Breech, CNS-C Palliative Medicine Team [facility] Health Team Phone: [phone] Pager: [phone]

## 2012-11-05 NOTE — Progress Notes (Signed)
PT Cancellation Note  Patient Details Name: Charles Guzman MRN: [id number] DOB: 1929-08-22   Cancelled Treatment:    Reason Eval/Treat Not Completed: Other (comment)  ORders received, chart reviewed;  Met with pt's family re: PT's role in care;  At this point, family seems unsure if they want to initiate PT -- noted also for Palliative Care Mtg today at 2 pm;   Please advise: if PT is indicated in GOC, will be happy to evaluate;   If PT is not congruent with GOC, will sign off,  Thanks, Van Clines, [place] [phone]    Van Clines [facility] 11/05/2012, 11:50 AM

## 2012-11-05 NOTE — Progress Notes (Signed)
Palliative Medicine Team consult to confirm goals of care and hospice options requested by Dr Sunnie Nielsen spoke with patient's son at bedside and daughter Babette Relic on phone [phone])  Meeting scheduled for today 11/05/12 @ 2:00 pm   Valente David, RN 11/05/2012, 9:45 AM Palliative Medicine Team RN Liaison [phone]

## 2012-11-05 NOTE — Care Management Note (Signed)
   CARE MANAGEMENT NOTE 11/05/2012  Patient:  Charles Guzman, Charles Guzman   Account Number:  [id number]  Date Initiated:  11/05/2012  Documentation initiated by:  Aizley Stenseth  Subjective/Objective Assessment:   Consult to office home hospice services choices to family. Family given list of Hospice Home Health providers in their area.     Action/Plan:   Pt daughter selected [facility] ([facility] This CM notifed that agency and discussed possible needs with the daughter, Ms Patsi Sears.   Anticipated DC Date:  11/07/2012   Anticipated DC Plan:  HOME W HOSPICE CARE         PAC Choice  HOSPICE   Choice offered to / List presented to:  C-4 Adult Children        HH arranged  HH-1 RN  HH-4 NURSE'S AIDE  HH-6 SOCIAL WORKER      HH agency  [facility] [place]   Status of service:  In process, will continue to follow Medicare Important Message given?   (If response is "NO", the following Medicare IM given date fields will be blank) Date Medicare IM given:   Date Additional Medicare IM given:    Discharge Disposition:  HOME W HOSPICE CARE  Per UR Regulation:    If discussed at Long Length of Stay Meetings, dates discussed:    Comments:  11/05/2012 Pt PCP is Dr Ralene Ok. daughters number is pt room number [phone] or home @ [phone]. CRoyal RN MPH, case manager, [phone]

## 2012-11-05 NOTE — Consult Note (Signed)
Patient ZO:XWRUEAV FINES KIMBERLIN      DOB: [birth year]      WUJ:[id number]     77 Note from the Palliative Medicine Team at [facility]    Consult Requested by: Dr Elisabeth Pigeon     PCP: Ralene Ok, MD Reason for Consultation:Goals of Care     Phone Number:[phone]  Assessment of patients Current state:  Patient lethargic, does awaken at times verbalizations unintelligible. Patient has not been eating well for past several weeks, refusing oral food/fluids.   Reviewed chart, spoke with staff caring for patient and proceeded to have family Goals of Care meeting with patients daughter, Charles Guzman, whom patient lives with, patients son, Charles Guzman, and his wife Charles Guzman. Family admits to noted mental/physical decline over last few weeks, patient was also c/o dysphagia. In addition patient was having increased difficulty with ambulation.   We discussed current medical issues, elevated serum potassium and progressing renal failure, and the fatal events related to these issues. We also talked about the possible medical interventions and the burdens this could present to the patient with his advanced dementia.  Discussed the philosophy of palliative care and hospice support and services provided. Also engaged in decision-making with family regarding concepts of Medical Orders for Scope of Treatment pertaining to cardiac and pulmonary resuscitation, desire regarding level of care and medical interventions for issues, the use of antibiotic therapy, intravenous hydration and continuing artificial tube feedings. A MOST form was completed, signed and placed chart with DNR goldenrod form.  Goals of Care: 1.  Code Status:DNR/DNI   2. Scope of Treatment: 1. Vital Signs:routine  2. Respiratory/Oxygen: medication support and oxygen for comfort as indicated 3. Nutritional Support/Tube Feeds:No artificial tube feedings 4. Antibiotics:No further use of antibiotics 5. Blood Products:No  6. HQI:ONGEXBMW at  [facility] rate during current hospital stay 7. Review of Medications to be discontinued: completed-stop Kayexalate intervention for high potassium level 8. Labs:No further blood draws or CBG monitoring 9. SCD's: No 10. Telemetry:Discontinue 11. Consults:Spiritual care   4. Disposition: daughter opting to take father back home with hospice support, she took care of mother on hospice, understands family is responsible for majority of care. Stated she has a daughter to help as well as nephew that can stay in the home. Social work informed family offered choice.    3. Symptom Management:   1. Anxiety/Agitation:Ativan IV as needed 2. Pain:RoxanolSL and IV Morphine ordered 3. Bowel Regimen:Dulcolax suppository as needed 4. Fever:Tylenol as needed 5. Poor Oral Intake: frequent mouth care 6. Terminal Secretions:Atropine SL as needed  4. Psychosocial:Emotional support to family with decision making process  5. Spiritual:consult placed   Patient Documents Completed or Given: Document Given Completed  Advanced Directives Pkt    MOST  YES  DNR  YES  Gone from My Sight  YES  Hard Choices  YES    Brief HPI: 79 yo WM with PMH dementia, renal failure, atrial fib, HTN, DM. Brought to ED on 11/05/11 by family due to AMS, poor oral intake and overall functional decline. Presented to ER with Creatinine of 7.94, baseline 11/13 2.95, and potassium of 7.5   ROS:  Unable to elicit due to patients demented state    PMH:  Past Medical History  Diagnosis Date  . Chronic kidney disease   . Coronary artery disease   . Hypertension   . GERD (gastroesophageal reflux disease)   . Pneumonia   . Diabetes mellitus   . Anemia      PSH: Past Surgical  History  Procedure Date  . Coronary artery bypass graft   . Joint replacement     knees   I have reviewed the FH and SH and  If appropriate update it with new information. No Known Allergies Scheduled Meds:   . pantoprazole (PROTONIX) IV  40 mg  Intravenous Q24H   Continuous Infusions:   . sodium chloride 200 mL (11/05/12 1643)   PRN Meds:.acetaminophen, acetaminophen, atropine, bisacodyl, hydrALAZINE, LORazepam, LORazepam, morphine, morphine injection    BP 112/76  Pulse 81  Temp 98 F (36.7 C) (Oral)  Resp 18  Ht 5\' 4"  (1.626 m)  Wt 88.225 kg (194 lb 8 oz)  BMI 33.39 kg/m2  SpO2 100%   PPS:10%        Intake/Output Summary (Last 24 hours) at 11/05/12 1820 Last data filed at 11/05/12 1700  Gross per 24 hour  Intake   2725 ml  Output   2030 ml  Net    695 ml   HYQ:MVHQI to admission                         Physical Exam:  General: lethargic, but arousable  HEENT:  conjunctivae clear, buccal mucosa dry, no lesions, dentition poor Chest:  CTA presently CVS:RRR Abdomen:BS audible, non-distended Ext: trace pedal edema Neuro:disoriented  Labs: CBC    Component Value Date/Time   WBC 8.8 11/05/2012 0740   RBC 3.98* 11/05/2012 0740   HGB 11.7* 11/05/2012 0740   HCT 35.8* 11/05/2012 0740   PLT 140* 11/05/2012 0740   MCV 89.9 11/05/2012 0740   MCH 29.4 11/05/2012 0740   MCHC 32.7 11/05/2012 0740   RDW 15.4 11/05/2012 0740   LYMPHSABS 1.8 11/04/2012 1456   MONOABS 0.8 11/04/2012 1456   EOSABS 0.1 11/04/2012 1456   BASOSABS 0.1 11/04/2012 1456    BMET    Component Value Date/Time   NA 142 11/05/2012 0740   K 7.0* 11/05/2012 0740   CL 117* 11/05/2012 0740   CO2 13* 11/05/2012 0740   GLUCOSE 119* 11/05/2012 0740   BUN 107* 11/05/2012 0740   CREATININE 7.04* 11/05/2012 0740   CALCIUM 9.0 11/05/2012 0740   GFRNONAA 6* 11/05/2012 0740   GFRAA 7* 11/05/2012 0740    CMP     Component Value Date/Time   NA 142 11/05/2012 0740   K 7.0* 11/05/2012 0740   CL 117* 11/05/2012 0740   CO2 13* 11/05/2012 0740   GLUCOSE 119* 11/05/2012 0740   BUN 107* 11/05/2012 0740   CREATININE 7.04* 11/05/2012 0740   CALCIUM 9.0 11/05/2012 0740   PROT 7.8 11/04/2012 1456   ALBUMIN 3.8 11/04/2012 1456   AST 14 11/04/2012 1456   ALT 7 11/04/2012 1456   ALKPHOS 81 11/04/2012 1456    BILITOT 0.4 11/04/2012 1456   GFRNONAA 6* 11/05/2012 0740   GFRAA 7* 11/05/2012 0740     Time In Time Out Total Time Spent with Patient Total Overall Time  2:30p 4:15p 75 min 75 min    Greater than 50%  of this time was spent counseling and coordinating care related to the above assessment and plan.   Freddie Breech, CNS-C Palliative Medicine Team [facility] Health Team Phone: [phone] Pager: [phone]

## 2012-11-05 NOTE — Progress Notes (Signed)
Nutrition Brief Note  Patient identified on the < 12 Braden score report. Per H&P review, patient's family wishes to continue comfort care. No nutrition interventions warranted at this time. Please consult RD as needed.  Kirkland Hun, RD, LDN Pager #: [phone] After-Hours Pager #: [phone]

## 2012-11-06 NOTE — Progress Notes (Signed)
[facility] PROGRESS NOTE  Charles Guzman HYQ:[id number] DOB: 14-Sep-1929 DOA: 11/04/2012 PCP: Ralene Ok, MD  Assessment/Plan: Acute encephalopathy  Multifactorial,secondary to worsening dementia, progressive decline, Renal failure.  Will check CT head rule out stroke.  Chest x ray no evidence of infection.  Dehydration, severe  Secondary to poor oral intake, worsening dementia. Acute on chronic renal failure  Worsening renal failure, probably pre renal in setting decrease volume, ACE, diuretic use.   Continue to hold Lasix and lisinopril.  Family wishes comfort measure. DM (diabetes mellitus) type II controlled with renal manifestation  Sliding scale. Hypertension  IV hydralazine PRN for BP control  Coagulopathy  Secondary to coumadin  Discontinue coumadin. Main goal is comfort.  Hyperkalemia  Recieved calcium gluconate x 1 dose  Family wishes no more treatment. No more labs.  Code Status: DNR.  Family Communication: son at bedside, plan of care discussed with him. Disposition Plan: home with hospice.  HPI/Subjective: Sleepy, open eyes to command. Son at bedside, plan to go home with hospice following.   Objective: Filed Vitals:   11/06/12 0534 11/06/12 0856 11/06/12 1400 11/06/12 1715  BP: 114/60 102/64 112/75 122/64  Pulse: 92 66 76 81  Temp: 98.7 F (37.1 C) 98.2 F (36.8 C) 98.6 F (37 C) 98.4 F (36.9 C)  TempSrc: Oral Oral Oral Oral  Resp: 18 18 18 18   Height:      Weight:      SpO2: 98% 98% 98% 98%    Intake/Output Summary (Last 24 hours) at 11/06/12 1850 Last data filed at 11/06/12 1824  Gross per 24 hour  Intake 395.25 ml  Output    250 ml  Net 145.25 ml   Filed Weights   11/04/12 1913 11/05/12 2156  Weight: 88.225 kg (194 lb 8 oz) 90.447 kg (199 lb 6.4 oz)    Exam:   General:  Sleepy, open eyes to command  Cardiovascular: S, 1, S 2  Respiratory: Crackles base.  Abdomen: BS present.   Data Reviewed: Basic Metabolic  Panel:  Lab 11/05/12 0740 11/04/12 1456  NA 142 140  K 7.0* >7.5*  CL 117* 108  CO2 13* 15*  GLUCOSE 119* 120*  BUN 107* 117*  CREATININE 7.04* 7.94*  CALCIUM 9.0 9.7  MG -- --  PHOS -- --   Liver Function Tests:  Lab 11/04/12 1456  AST 14  ALT 7  ALKPHOS 81  BILITOT 0.4  PROT 7.8  ALBUMIN 3.8   No results found for this basename: LIPASE:5,AMYLASE:5 in the last 168 hours No results found for this basename: AMMONIA:5 in the last 168 hours CBC:  Lab 11/05/12 0740 11/04/12 1456  WBC 8.8 11.3*  NEUTROABS -- 8.5*  HGB 11.7* 13.5  HCT 35.8* 40.3  MCV 89.9 89.0  PLT 140* 178   Cardiac Enzymes: No results found for this basename: CKTOTAL:5,CKMB:5,CKMBINDEX:5,TROPONINI:5 in the last 168 hours BNP (last 3 results) No results found for this basename: PROBNP:3 in the last 8760 hours CBG:  Lab 11/05/12 1139 11/05/12 0614 11/05/12 0510  GLUCAP 85 135* 69*    Recent Results (from the past 240 hour(s))  CULTURE, BLOOD (ROUTINE X 2)     Status: Normal (Preliminary result)   Collection Time   11/04/12  2:30 PM      Component Value Range Status Comment   Specimen Description BLOOD ARM RIGHT   Final    Special Requests BOTTLES DRAWN AEROBIC AND ANAEROBIC 10CC   Final    Culture  Setup Time 11/04/2012  21:26   Final    Culture     Final    Value:        BLOOD CULTURE RECEIVED NO GROWTH TO DATE CULTURE WILL BE HELD FOR 5 DAYS BEFORE ISSUING A FINAL NEGATIVE REPORT   Report Status PENDING   Incomplete   CULTURE, BLOOD (ROUTINE X 2)     Status: Normal (Preliminary result)   Collection Time   11/04/12  3:00 PM      Component Value Range Status Comment   Specimen Description BLOOD HAND LEFT   Final    Special Requests BOTTLES DRAWN AEROBIC ONLY 3CC   Final    Culture  Setup Time 11/04/2012 21:26   Final    Culture     Final    Value:        BLOOD CULTURE RECEIVED NO GROWTH TO DATE CULTURE WILL BE HELD FOR 5 DAYS BEFORE ISSUING A FINAL NEGATIVE REPORT   Report Status PENDING    Incomplete   URINE CULTURE     Status: Normal (Preliminary result)   Collection Time   11/05/12 10:39 AM      Component Value Range Status Comment   Specimen Description URINE, CATHETERIZED   Final    Special Requests NONE   Final    Culture  Setup Time 11/05/2012 17:36   Final    Colony Count PENDING   Incomplete    Culture Culture reincubated for better growth   Final    Report Status PENDING   Incomplete   MRSA PCR SCREENING     Status: Normal   Collection Time   11/06/12  3:42 AM      Component Value Range Status Comment   MRSA by PCR NEGATIVE  NEGATIVE Final      Studies: No results found.  Scheduled Meds:   . pantoprazole (PROTONIX) IV  40 mg Intravenous Q24H   Continuous Infusions:   . sodium chloride 200 mL (11/05/12 1643)    Principal Problem:  *Acute encephalopathy Active Problems:  Dehydration, severe  Acute on chronic renal failure  DM (diabetes mellitus) type II controlled with renal manifestation  Hypotension, postural  Hypertension  Coagulopathy  Hyperkalemia  Anxiety  Pain, low back  Increased oropharyngeal secretions  Inadequate oral intake    Time spent: 25 minutes.     Charles Guzman  [facility] Pager [phone]. If 8PM-8AM, please contact night-coverage at [email or web address], password [facility] 11/06/2012, 6:50 PM  LOS: 2 days

## 2012-11-06 NOTE — Progress Notes (Signed)
Follow-up r/tpatient and family request services of [facility]) after discharge.  Spoke with Tammy last evening to initiate education related to hospice services, philosophy and team approach to care - shegood understanding.  Per discussion with family and CMRN plan is for discharge Friday 11/07/12 once everything arranged. Patient will required ambulance transport and family requests foley catheter be left in for comfort  DME has been requested for delivery to the home later today: Complete Oxygen Package B-  Patient has been on [facility] during hospital stay; Complete Package D: fully electric hospital bed w AP&P mattress and overbed table CMRN aware of DME requests and will notify [facility] representative to arrange delivery  *Please contact dtr Tammy @ [phone] to arrange delivery of equipment Patient's PCP attending with [facility] is Dr Littie Deeds; pharmacy [facility] Initial paperwork faxed to [facility]  Completed d/c summary will need to be faxed to [facility] @ [phone] when final Please notify [facility] when patient is ready to leave unit at d/c call [phone] (or [phone] if after 5 pm);  [facility] information and contact numbers also given to Tammy during conversation and son Butch during visit this morning Above information shared with Kristine Garbe [facility] Please call with any questions or concerns   Valente David, RN 11/06/2012, 10:01 AM [facility] Palliative Medicine Team RN Liaison [phone]

## 2012-11-06 NOTE — Care Management Note (Signed)
   CARE MANAGEMENT NOTE 11/06/2012  Patient:  Charles Guzman, Charles Guzman   Account Number:  [id number]  Date Initiated:  11/05/2012  Documentation initiated by:  Coyle Stordahl  Subjective/Objective Assessment:   Consult to office home hospice services choices to family. Family given list of Hospice Home Health providers in their area.     Action/Plan:   Pt daughter selected [facility] This CM notifed that agency and discussed possible needs with the daughter, [name].   Anticipated DC Date:  11/07/2012   Anticipated DC Plan:  HOME W HOSPICE CARE         PAC Choice  HOSPICE   Choice offered to / List presented to:  C-4 Adult Children      DME agency  [facility]     [facility] arranged  HH-1 RN  HH-4 NURSE'S AIDE  HH-6 SOCIAL WORKER      HH agency  [facility] [place]   Status of service:  Completed, signed off Medicare Important Message given?   (If response is "NO", the following Medicare IM given date fields will be blank) Date Medicare IM given:   Date Additional Medicare IM given:    Discharge Disposition:  HOME W HOSPICE CARE  Per UR Regulation:    If discussed at Long Length of Stay Meetings, dates discussed:    Comments:  11/06/2012 Per Hospice RN Charles Guzman pt will need package B and D for DME and oxygen in the home. Ordered by this CM and confirmed with [facility] that order had arrived @ 1030am. [facility] will arrange delivery with pt daughter, Charles Guzman. phone numbers supplied to [facility]. Plan for d/c 11/07/2012 am. Johny Shock RN MPH Case manager, [phone]   11/05/2012 Pt PCP is Dr Ralene Ok. daughters number is pt room number [phone] or home @ [phone]. CRoyal RN MPH, case manager, [phone]

## 2012-11-06 NOTE — Progress Notes (Signed)
Patient Charles Guzman      DOB: 08/14/1929      YNW:[id number]   Palliative Medicine Team at [facility] Progress Note    Subjective:Patient sleeping, only minimal sips oral input. Having intermittent lower back pain controlled with Morphine as needed. Respirations unlabored. Appears comfortable.      Filed Vitals:   11/06/12 0534  BP: 114/60  Pulse: 92  Temp: 98.7 F (37.1 C)  Resp: 18   Physical exam:  General: Resting comfortably HEENT: lips dry CHEST: CTA bilaterally ABD: BS audible GU: foley d/w clear medium amber urine EXT: L ankle trace pedal/ankle edema   Assessment and plan: 79 yo WM with PMH dementia, renal failure, atrial fib, HTN, DM. Brought to ED on 11/05/11 by family due to AMS, poor oral intake and overall functional decline. Presented to ER with Creatinine of 7.94, baseline 11/13 2.95, and potassium of 7.5  1. Anxiety/Agitation:Ativan IV as needed 2. Pain:RoxanolSL and IV Morphine ordered 3. Bowel Regimen:Dulcolax suppository as needed 4. Fever:Tylenol as needed 5. Poor Oral Intake: frequent mouth care 6. Disposition: plan for discharge home with hospice, arrangements in process   Time In Time Out Total Time Spent with Patient Total Overall Time  8:15a 8:30a 15 min 15 min     Freddie Breech, CNS-C Palliative Medicine Team [facility] Health Team Phone: [phone] Pager: [phone]

## 2012-11-06 NOTE — Progress Notes (Signed)
PT Cancellation/ Discharge Note  Patient Details Name: Charles Guzman MRN: [id number] DOB: 1929/07/03   Cancelled Treatment:    Reason Eval/Treat Not Completed: Other (comment) (PT eval not within realm of pt's POC currently).  Family has decided to take pt. Home with hospice care and comfort measures/symptom management.  Discussed with son and he declines PT involvement.  Will sign off.   Ferman Hamming 11/06/2012, 10:46 AM Weldon Picking PT Acute Rehab Services [phone] Beeper [phone]

## 2012-11-07 DIAGNOSIS — F411 Generalized anxiety disorder: Secondary | ICD-10-CM

## 2012-11-07 DIAGNOSIS — N179 Acute kidney failure, unspecified: Secondary | ICD-10-CM

## 2012-11-07 MED ORDER — LORAZEPAM 2 MG/ML IJ SOLN: 0.5000 mg | INTRAMUSCULAR | Status: DC | PRN

## 2012-11-07 MED ORDER — ATROPINE SULFATE 1 % OP SOLN: OPHTHALMIC | Status: AC

## 2012-11-07 MED ORDER — MORPHINE SULFATE (CONCENTRATE) 20 MG/ML PO SOLN: 5.0000 mg | ORAL | Status: AC | PRN

## 2012-11-07 MED ORDER — LORAZEPAM 0.5 MG PO TABS: 0.5000 mg | ORAL_TABLET | Freq: Four times a day (QID) | ORAL | Status: AC | PRN

## 2012-11-07 NOTE — Discharge Summary (Addendum)
Physician Discharge Summary  Charles Guzman ZOX:[id number] DOB: [birth year] DOA: 11/04/2012  PCP: Ralene Ok, MD  Admit date: 11/04/2012 Discharge date: 11/07/2012  Time spent: 35 minutes  Recommendations for Outpatient Follow-up:  1. Follow up with Hospice for symptoms control.   Discharge Diagnoses:   Acute encephalopathy  Acute on chronic renal failure  Dehydration, severe   DM (diabetes mellitus) type II controlled with renal manifestation  Hypotension, postural  Hypertension  Coagulopathy  Hyperkalemia  Anxiety  Pain, low back  Increased oropharyngeal secretions  Inadequate oral intake   Discharge Condition: Stable.   Diet recommendation: Regular diet.   Filed Weights   11/04/12 1913 11/05/12 2156 11/06/12 2338  Weight: 88.225 kg (194 lb 8 oz) 90.447 kg (199 lb 6.4 oz) 89.359 kg (197 lb)    History of present illness:  79 year old male with past medical history of dementia, renal failure, atrial fibrillation (on coumadin), HTN, DM who was brought in by family due to worsening mental status, poor oral intake and progressive decline. Patient is not a good historian due to his mental status and family is not currently available to provide details of patient's illness. In ED, patient was found to have worsening creatinine of 7.94 (in 08/2012 creatinine was 2.95), hyperkalemia of more than 7.5 and [facility] was asked to admit for further management. Patient's family reported to ED physician that they wanted to continue comfort care only, no further interventions.   Hospital Course:  Patient was admitted with encephalopathy, acute on chronic renal failure, hyperkalemia. He was started on IV fluids, and Kayexalate was ordered. A workup for encephalopathy was negative for infection and stroke. Encephalopathy is probably secondary to uremia, Component of worsening dementia. CT head was negative, chest x-ray negative for infection. Patient family Wanted  palliative care. Family met with  the palliative care team and decision was to continue with comfort measure. His renal failure probably multifactorial in the setting of decrease volume [place], diuretic use on chronic renal failure. Different options for treatment was discussed with the family, and they opted for comfort measure. Patient will be discharged on Xanax and morphine for symptoms control. Hospice to followup at home.  Acute encephalopathy  Multifactorial,secondary to worsening dementia, progressive decline, Renal failure.  Will check CT head rule out stroke.  Chest x ray no evidence of infection.  Dehydration, severe  Secondary to poor oral intake, worsening dementia. Acute on chronic renal failure  Worsening renal failure, probably pre renal in setting decrease volume, ACE, diuretic use.  Continue to hold Lasix and lisinopril.  Family wishes comfort measure. DM (diabetes mellitus) type II controlled with renal manifestation  Sliding scale. Hypertension  IV hydralazine PRN for BP control  Coagulopathy  Secondary to coumadin  Discontinue coumadin. Main goal is comfort.  Hyperkalemia  Recieved calcium gluconate x 1 dose  Family wishes no more treatment. No more labs.  Code Status: DNR.  Family Communication: Daughter at bedside, care discussed with her. She is in agreement to continue with comfort measure.  Disposition Plan: home with hospice.   Procedures:  None.  Consultations:  Palliative care team.   Discharge Exam: Filed Vitals:   11/06/12 0856 11/06/12 1400 11/06/12 1715 11/06/12 2338  BP: 102/64 112/75 122/64   Pulse: 66 76 81 105  Temp: 98.2 F (36.8 C) 98.6 F (37 C) 98.4 F (36.9 C) 98.7 F (37.1 C)  TempSrc: Oral Oral Oral Oral  Resp: 18 18 18 19   Height:  Weight:    89.359 kg (197 lb)  SpO2: 98% 98% 98% 94%    General: Awake, confuse.  Cardiovascular: S 1, S 2  Respiratory: Crackles bases Neuro: Awake, confused.   Discharge Instructions  Discharge Orders    Future  Orders Please Complete By Expires   Diet general      Increase activity slowly          Medication List     As of 11/07/2012 10:08 AM    STOP taking these medications         atorvastatin 20 MG tablet   Commonly known as: LIPITOR      furosemide 40 MG tablet   Commonly known as: LASIX      insulin detemir 100 UNIT/ML injection   Commonly known as: LEVEMIR      lisinopril 10 MG tablet   Commonly known as: PRINIVIL,ZESTRIL      metoprolol succinate 50 MG 24 hr tablet   Commonly known as: TOPROL-XL      potassium chloride 10 MEQ CR tablet   Commonly known as: KLOR-CON      sitaGLIPtin 100 MG tablet   Commonly known as: JANUVIA      warfarin 3 MG tablet   Commonly known as: COUMADIN      TAKE these medications         amitriptyline 10 MG tablet   Commonly known as: ELAVIL   Take 10 mg by mouth at bedtime. Depression        atropine 1 % ophthalmic solution   2 drops under the tongue every four Hours as needed for increase secration      insulin lispro 100 UNIT/ML injection   Commonly known as: HUMALOG   Inject into the skin 3 (three) times daily before meals. Sliding scale      LORazepam 2 MG/ML injection   Commonly known as: ATIVAN   Inject 0.25 mLs (0.5 mg total) into the vein every 4 (four) hours as needed for anxiety (agitation).      morphine 20 MG/ML concentrated solution   Commonly known as: ROXANOL   Take 0.25 mLs (5 mg total) by mouth every 4 (four) hours as needed.      ranitidine 300 MG tablet   Commonly known as: ZANTAC   Take 300 mg by mouth daily.      traMADol 50 MG tablet   Commonly known as: ULTRAM   Take 50 mg by mouth 2 (two) times daily. Maximum dose= 8 tablets per day           Follow-up Information    Follow up with [facility] ([facility] ([facility] to follow aftr d/c -pls notify whn transport on unit & pt ready to leave call [phone] (or [phone] aftr 5pm))    Contact information:   [facility] [address]  [facility] [phone]          The results of significant diagnostics from this hospitalization (including imaging, microbiology, ancillary and laboratory) are listed below for reference.    Significant Diagnostic Studies: Dg Chest Portable 1 View  11/04/2012  *RADIOLOGY REPORT*  Clinical Data: Altered mental status.  PORTABLE CHEST - 1 VIEW  Comparison: 11/20/2011  Findings: Elevation of the right hemidiaphragm.  Bibasilar atelectasis.  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  Heart is upper limits normal in size.  Mild vascular congestion and peribronchial thickening.  No effusions.  No acute bony abnormality.  IMPRESSION: Low lung volumes.  Elevation of the  right hemidiaphragm with bibasilar atelectasis.  Mild vascular congestion.   Original Report Authenticated By: Charlett Nose, M.D.     Microbiology: Recent Results (from the past 240 hour(s))  CULTURE, BLOOD (ROUTINE X 2)     Status: Normal (Preliminary result)   Collection Time   11/04/12  2:30 PM      Component Value Range Status Comment   Specimen Description BLOOD ARM RIGHT   Final    Special Requests BOTTLES DRAWN AEROBIC AND ANAEROBIC 10CC   Final    Culture  Setup Time 11/04/2012 21:26   Final    Culture     Final    Value:        BLOOD CULTURE RECEIVED NO GROWTH TO DATE CULTURE WILL BE HELD FOR 5 DAYS BEFORE ISSUING A FINAL NEGATIVE REPORT   Report Status PENDING   Incomplete   CULTURE, BLOOD (ROUTINE X 2)     Status: Normal (Preliminary result)   Collection Time   11/04/12  3:00 PM      Component Value Range Status Comment   Specimen Description BLOOD HAND LEFT   Final    Special Requests BOTTLES DRAWN AEROBIC ONLY 3CC   Final    Culture  Setup Time 11/04/2012 21:26   Final    Culture     Final    Value:        BLOOD CULTURE RECEIVED NO GROWTH TO DATE CULTURE WILL BE HELD FOR 5 DAYS BEFORE ISSUING A FINAL NEGATIVE REPORT   Report Status PENDING   Incomplete   URINE CULTURE     Status: Normal (Preliminary result)    Collection Time   11/05/12 10:39 AM      Component Value Range Status Comment   Specimen Description URINE, CATHETERIZED   Final    Special Requests NONE   Final    Culture  Setup Time 11/05/2012 17:36   Final    Colony Count PENDING   Incomplete    Culture Culture reincubated for better growth   Final    Report Status PENDING   Incomplete   MRSA PCR SCREENING     Status: Normal   Collection Time   11/06/12  3:42 AM      Component Value Range Status Comment   MRSA by PCR NEGATIVE  NEGATIVE Final      Labs: Basic Metabolic Panel:  Lab 11/05/12 0454 11/04/12 1456  NA 142 140  K 7.0* >7.5*  CL 117* 108  CO2 13* 15*  GLUCOSE 119* 120*  BUN 107* 117*  CREATININE 7.04* 7.94*  CALCIUM 9.0 9.7  MG -- --  PHOS -- --   Liver Function Tests:  Lab 11/04/12 1456  AST 14  ALT 7  ALKPHOS 81  BILITOT 0.4  PROT 7.8  ALBUMIN 3.8   CBC:  Lab 11/05/12 0740 11/04/12 1456  WBC 8.8 11.3*  NEUTROABS -- 8.5*  HGB 11.7* 13.5  HCT 35.8* 40.3  MCV 89.9 89.0  PLT 140* 178   Cardiac Enzymes: No results found for this basename: CKTOTAL:5,CKMB:5,CKMBINDEX:5,TROPONINI:5 in the last 168 hours BNP: BNP (last 3 results) No results found for this basename: PROBNP:3 in the last 8760 hours CBG:  Lab 11/05/12 1139 11/05/12 0614 11/05/12 0510  GLUCAP 85 135* 69*       Signed:  Henrietta Cieslewicz  [facility] 11/07/2012, 10:08 AM

## 2012-11-07 NOTE — Progress Notes (Signed)
Patient WU:[id number] [name]      DOB: 04-13-1929      NWG:[id number]  Patient comfortable sleeping soundly, daughter at bedside. Plan for discharge home today with hospice services.  Charles Guzman, Charles Guzman Palliative Medicine Team [facility] Team Phone: [phone] Pager: [phone]

## 2012-11-07 NOTE — Progress Notes (Signed)
Went over discharge instructions with patient's daughter at the bedside.  Scripts sent with daughter.  MOST form and gold DNR form sent with [facility].  Questions answered to daughter's satisfaction.  Patient's daughter denies any questions.  Daughter states she has all of patient's belongings, including hearing aid.  IV discontinued.  Foley remains per family request as patient is comfort care.

## 2012-11-07 NOTE — Care Management Note (Signed)
   CARE MANAGEMENT NOTE 11/07/2012  Patient:  Charles Guzman, Charles Guzman   Account Number:  [id number]  Date Initiated:  11/05/2012  Documentation initiated by:  Lynell Kussman  Subjective/Objective Assessment:   Consult to office home hospice services choices to family. Family given list of Hospice Home Health providers in their area.     Action/Plan:   Pt daughter selected [facility] ([facility] This CM notifed that agency and discussed possible needs with the daughter, Ms Patsi Sears.   Anticipated DC Date:  11/07/2012   Anticipated DC Plan:  HOME W HOSPICE CARE         PAC Choice  HOSPICE   Choice offered to / List presented to:  C-4 Adult Children      DME agency  [facility]     [facility] arranged  HH-1 RN  HH-4 NURSE'S AIDE  HH-6 SOCIAL WORKER      HH agency  [facility]   Status of service:  Completed, signed off Medicare Important Message given?   (If response is "NO", the following Medicare IM given date fields will be blank) Date Medicare IM given:   Date Additional Medicare IM given:    Discharge Disposition:  HOME W HOSPICE CARE  Per UR Regulation:    If discussed at Long Length of Stay Meetings, dates discussed:    Comments:  11/07/2012 Pt for d/c today as all DME has been delivered per daughter. CSW will arrange for ambulance transportation to home. Johny Shock RN MPH Case Manager, [phone]  11/06/2012 Per Hospice RN Valente David pt will need package B and D for DME and oxygen in the home. Ordered by this CM and confirmed with [facility] that order had arrived @ 1030am. [facility] will arrange delivery with pt daughter, Patsi Sears. phone numbers supplied to [facility]. Plan for d/c 11/07/2012 am. Johny Shock RN MPH Case manager, [phone]   11/05/2012 Pt PCP is Dr Ralene Ok. daughters number is pt room number [phone] or home @ [phone]. CRoyal RN MPH, case manager, [phone]

## 2012-11-08 LAB — URINE CULTURE

## 2012-11-10 LAB — CULTURE, BLOOD (ROUTINE X 2): Culture: NO GROWTH

## 2012-11-29 DEATH — deceased

## 2013-03-23 IMAGING — RF DG UGI W/ KUB
18 of 23 series · 18 of 23 positions shown · non-contrast
Comparison: CT abdomen pelvis of 07/31/2007

CLINICAL DATA: Dysphagia

UPPER GI SERIES WITH KUB
TECHNIQUE: Routine upper GI series was performed with thin barium
Fluoroscopy Time: 2.4 minutes

[Series 3: run · 1 of 1 slices shown (1 of 17)]
[im 1/1]
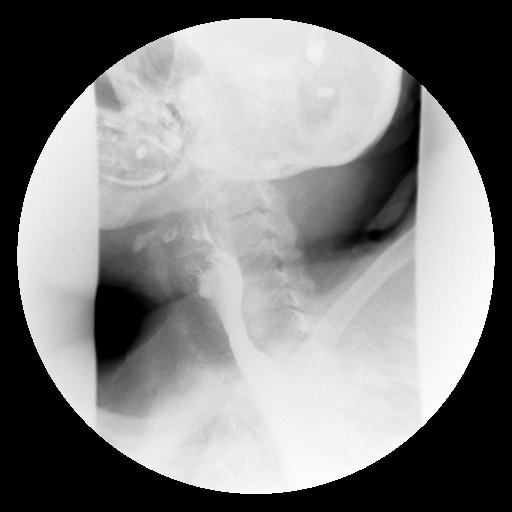

[Series 4: run · 1 of 1 slices shown (2 of 17)]
[im 1/1]
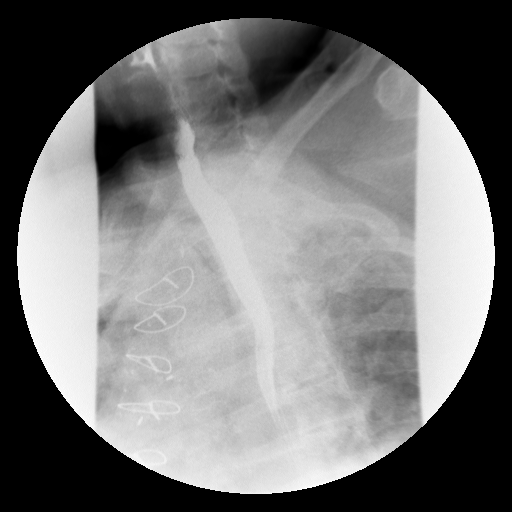

[Series 6: run · 1 of 1 slices shown (3 of 17)]
[im 1/1]
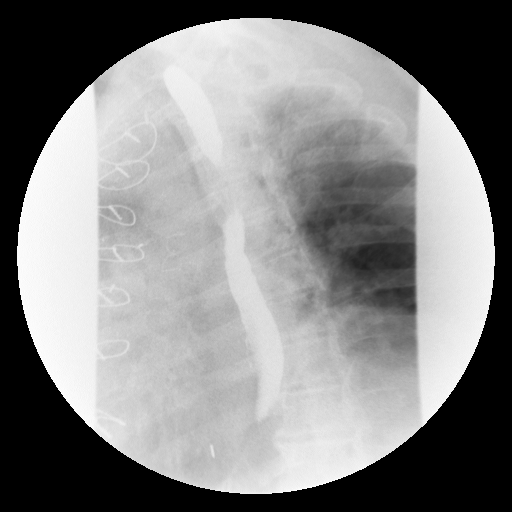

[Series 7: run · 1 of 1 slices shown (4 of 17)]
[im 1/1]
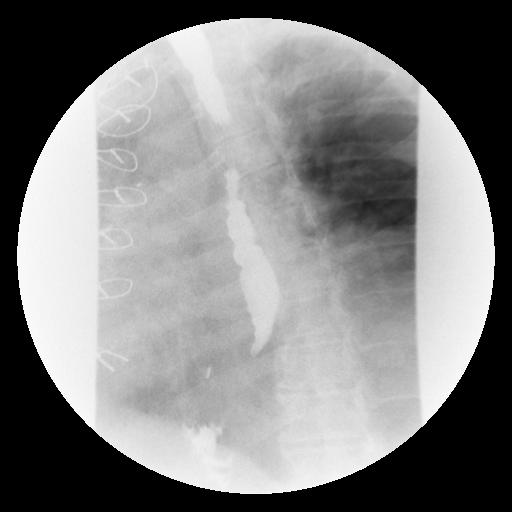

[Series 8: run · 1 of 1 slices shown (5 of 17)]
[im 1/1]
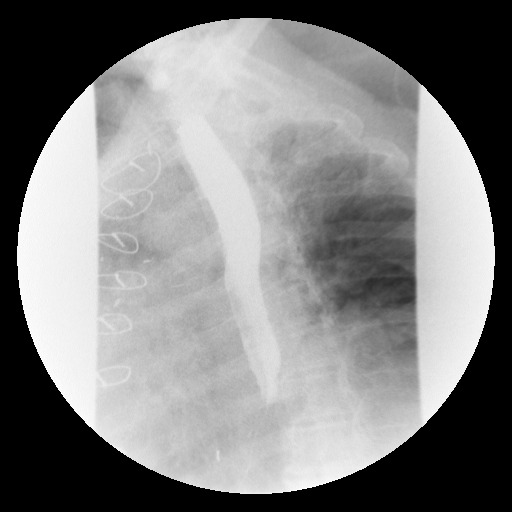

[Series 10: run · 1 of 1 slices shown (6 of 17)]
[im 1/1]
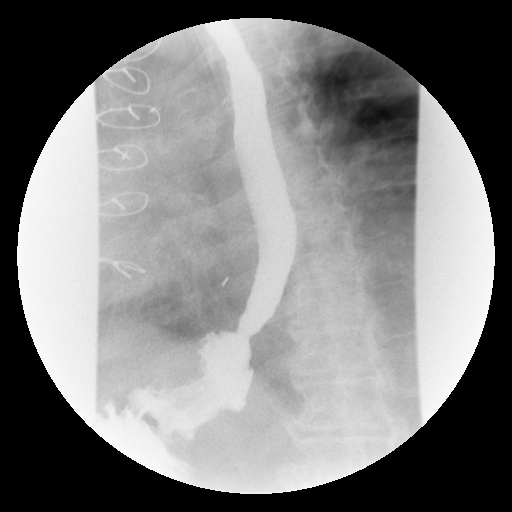

[Series 11: run · 1 of 1 slices shown (7 of 17)]
[im 1/1]
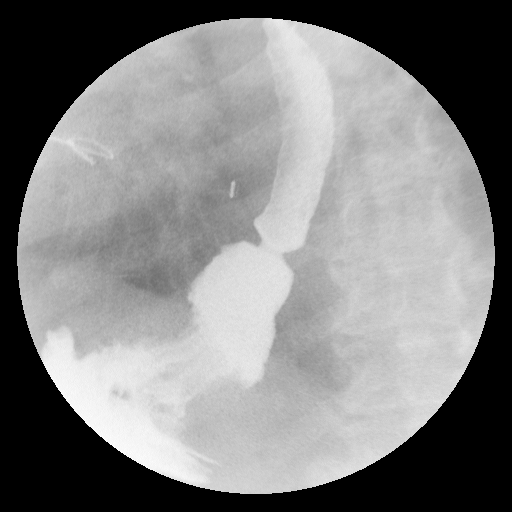

[Series 12: run · 1 of 1 slices shown (8 of 17)]
[im 1/1]
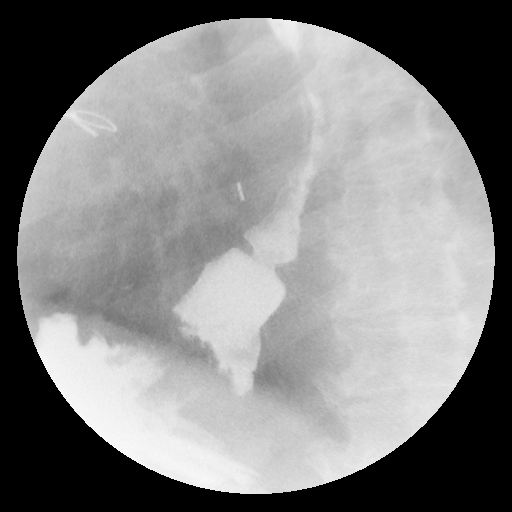

[Series 13: run · 1 of 1 slices shown (9 of 17)]
[im 1/1]
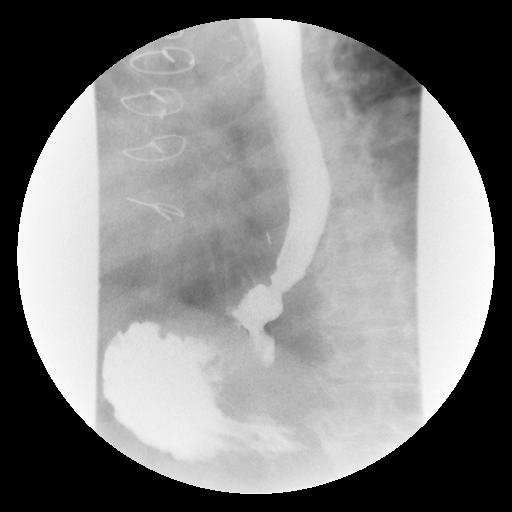

[Series 15: run · 1 of 1 slices shown (10 of 17)]
[im 1/1]
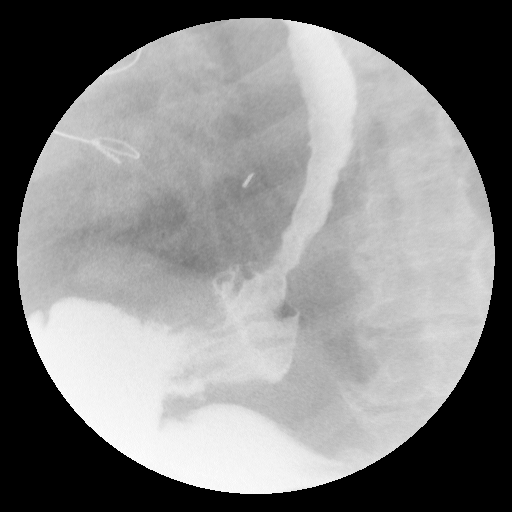

[Series 16: run · 1 of 1 slices shown (11 of 17)]
[im 1/1]
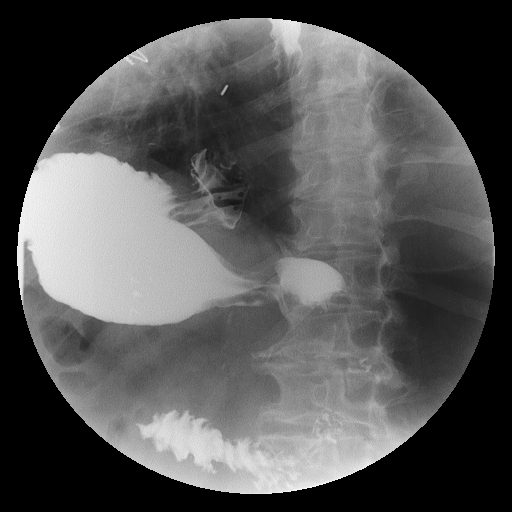

[Series 17: run · 1 of 1 slices shown (12 of 17)]
[im 1/1]
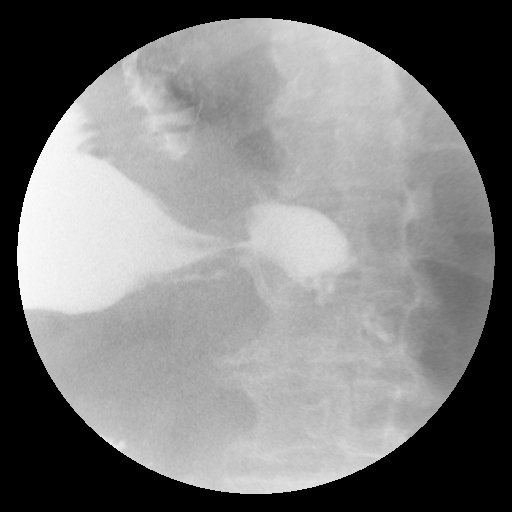

[Series 18: run · 1 of 1 slices shown (13 of 17)]
[im 1/1]
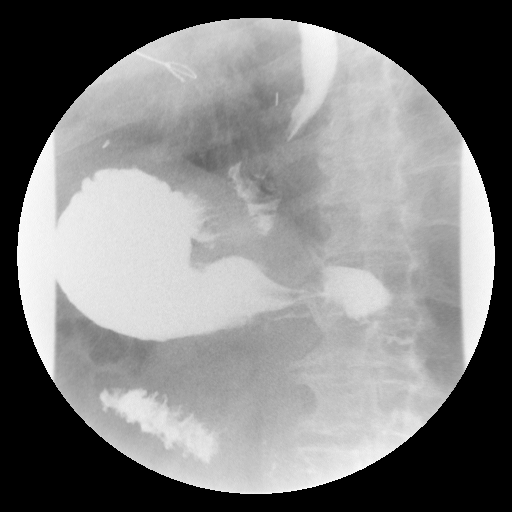

[Series 20: run · 1 of 1 slices shown (14 of 17)]
[im 1/1]
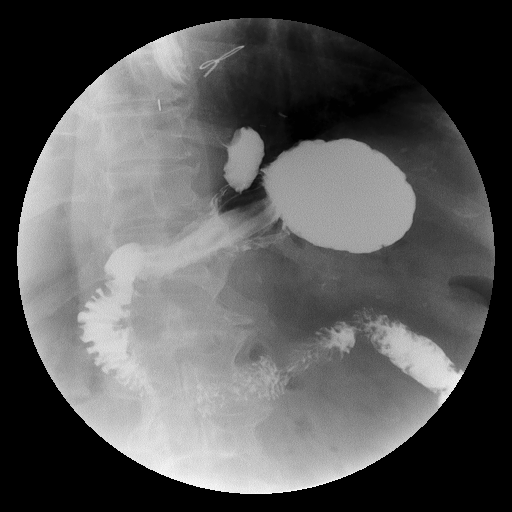

[Series 21: run · 1 of 1 slices shown (15 of 17)]
[im 1/1]
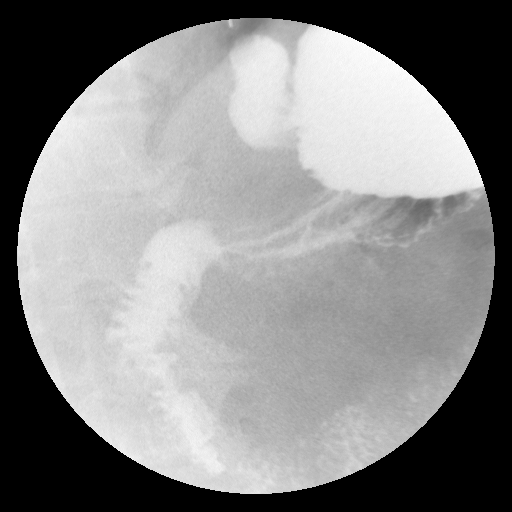

[Series 23: run · 1 of 1 slices shown (16 of 17)]
[im 1/1]
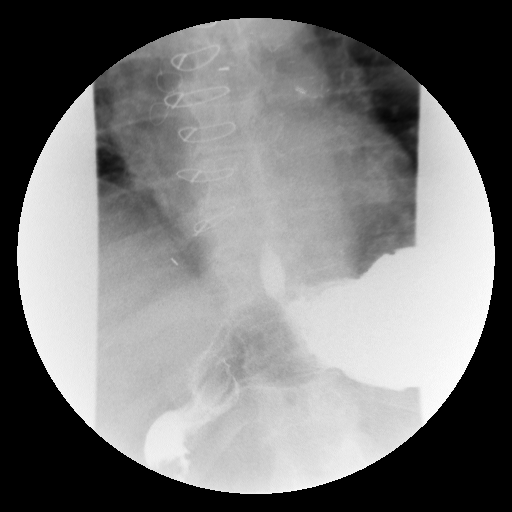

[Series 25: run · 1 of 1 slices shown (17 of 17)]
[im 1/1]
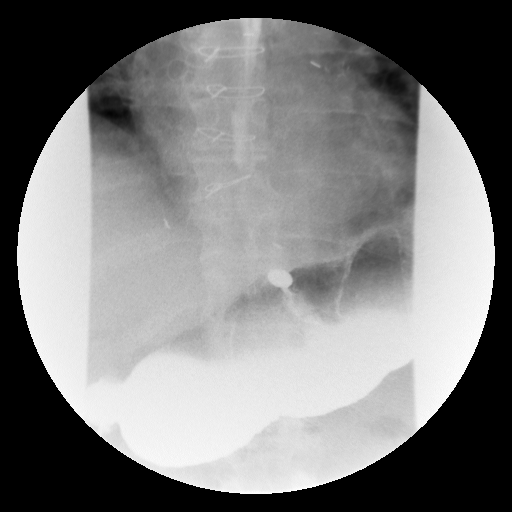

[Series 1001: view not recorded · 0.20mm/px · 1 of 1 slices shown]
[im 1/1]
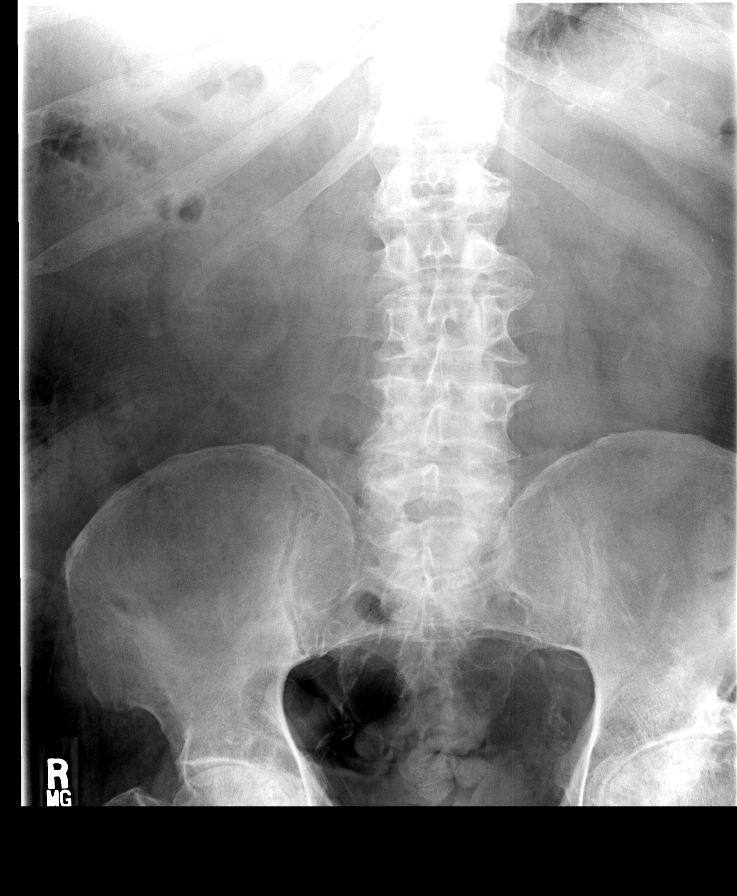

[18 of 23 positions shown; findings below may reference images not displayed]

FINDINGS: A single contrast study was performed.  The swallowing
mechanism is unremarkable.  There are moderate tertiary
contractions in the mid and distal esophagus.  No aspiration is
seen.  A moderate sized hiatal hernia is present. Moderate
gastroesophageal reflux is demonstrated.  There is a persistent
narrowing of the distal esophagus just above the hernia. A barium
pill was given at the end of the study which lodged at the level of
the short segment distal esophageal stricture and did not pass into
the stomach.

The stomach distends with no gastric lesion evident. Gastric
peristalsis is normal.  The duodenal bulb fills and the duodenal
loop is in normal position.
IMPRESSION: 1.  Moderate sized hiatal hernia.
2.  Short segment distal esophageal stricture where the barium pill
does lodge.
3.  Moderate tertiary contractions.
4.  Gastroesophageal reflux.

## 2015-09-16 ENCOUNTER — Telehealth: Payer: Self-pay | Admitting: Infectious Diseases

## 2015-09-19 NOTE — Telephone Encounter (Signed)
error
